# Patient Record
Sex: Female | Born: 1970 | Race: White | Hispanic: No | Marital: Married | Smoking: Former smoker
Health system: Southern US, Community
[De-identification: ages and names within clinical notes are randomized; demographics above are authoritative.]

## PROBLEM LIST (undated history)

## (undated) DIAGNOSIS — Z9889 Other specified postprocedural states: Secondary | ICD-10-CM

## (undated) DIAGNOSIS — F329 Major depressive disorder, single episode, unspecified: Secondary | ICD-10-CM

## (undated) DIAGNOSIS — K219 Gastro-esophageal reflux disease without esophagitis: Secondary | ICD-10-CM

## (undated) DIAGNOSIS — M1990 Unspecified osteoarthritis, unspecified site: Secondary | ICD-10-CM

## (undated) DIAGNOSIS — E039 Hypothyroidism, unspecified: Secondary | ICD-10-CM

## (undated) DIAGNOSIS — F32A Depression, unspecified: Secondary | ICD-10-CM

## (undated) DIAGNOSIS — R112 Nausea with vomiting, unspecified: Secondary | ICD-10-CM

## (undated) HISTORY — PX: BREAST SURGERY: SHX581

## (undated) HISTORY — PX: DILITATION & CURRETTAGE/HYSTROSCOPY WITH NOVASURE ABLATION: SHX5568

## (undated) HISTORY — PX: CHOLECYSTECTOMY: SHX55

---

## 2001-06-04 ENCOUNTER — Ambulatory Visit (HOSPITAL_COMMUNITY): Admission: RE | Admit: 2001-06-04 | Discharge: 2001-06-04 | Payer: Self-pay | Admitting: Internal Medicine

## 2001-06-04 HISTORY — PX: ESOPHAGOGASTRODUODENOSCOPY (EGD) WITH ESOPHAGEAL DILATION: SHX5812

## 2003-04-29 ENCOUNTER — Ambulatory Visit (HOSPITAL_COMMUNITY): Admission: RE | Admit: 2003-04-29 | Discharge: 2003-04-29 | Payer: Self-pay | Admitting: Internal Medicine

## 2003-04-29 HISTORY — PX: COLONOSCOPY: SHX174

## 2003-04-29 HISTORY — PX: ESOPHAGOGASTRODUODENOSCOPY: SHX1529

## 2007-04-22 ENCOUNTER — Encounter: Admission: RE | Admit: 2007-04-22 | Discharge: 2007-04-22 | Payer: Self-pay | Admitting: Gastroenterology

## 2009-03-24 ENCOUNTER — Ambulatory Visit (HOSPITAL_COMMUNITY): Admission: RE | Admit: 2009-03-24 | Discharge: 2009-03-24 | Payer: Self-pay | Admitting: Gastroenterology

## 2009-03-24 ENCOUNTER — Encounter: Admission: RE | Admit: 2009-03-24 | Discharge: 2009-03-24 | Payer: Self-pay | Admitting: Endocrinology

## 2010-10-14 NOTE — H&P (Signed)
NAME:  Valerie George NO.:  [id number]   MEDICAL RECORD NO.:  [id number]                  PATIENT TYPE:   LOCATION:                                       FACILITY:   PHYSICIAN:  R. Roetta Sessions, M.D.              DATE OF BIRTH:  06-19-1970   DATE OF ADMISSION:  04/20/2003  DATE OF DISCHARGE:                                HISTORY & PHYSICAL   CHIEF COMPLAINT:  Intermittent epigastric pain.   HISTORY OF PRESENT ILLNESS:  Valerie George is a 40 year old lady who  had her gallbladder out in February 2004 by Dr. Gabriel Cirri for biliary  dyskinesia.  Since that time, she has had some intermittent nausea and  boring epigastric pain.  He performed an EGD back in April 2004 and found  her stomach was full of food and was not able to complete the exam.  She did  have a gastric emptying study that demonstrated slight prolonged T1/2 (91  minutes).  She also has had significant constipation which has more recently  been combated most effectively with MiraLax 17 g orally daily (for a bowel  movement every day).  She was briefly on Reglan and actually tried Zelnorm.  She has no more nausea and vomiting.  Of note, she does have episodes of  boring epigastric pain which typically awake her from sleep anywhere from 2  to 3 o'clock in the morning.  It lasts no more than approximately 3 minutes.  She gets relief with drinking milk.  Her typical reflux symptoms have been  long well controlled on Prevacid 30 mg orally daily.  She does not have any  odynophagia or dysphagia although she did have some oropharyngeal and  esophageal dysphagia early last year for which I performed an EGD at [facility] and found only a small hiatal hernia.  She responded nicely to  passage of a 54-French Maloney dilator.  She is not taking any  nonsteroidals.  She has not had any melena or rectal bleeding.  LFT's were  normal on December 25, 2002 (amylase was also normal).   PAST  MEDICAL HISTORY:  1. Gastroesophageal reflux disease.  2. Biliary dyskinesia.  3. New history of diabetes mellitus.   PAST SURGICAL HISTORY:  Cholecystectomy in February 2004.   CURRENT MEDICATIONS:  1. Prevacid 30 mg orally daily.  2. Minocycline 100 mg p.r.n.  3. MiraLax 17 g orally daily.   SOCIAL HISTORY:  She has been married for seven years.  She has a 7-1/2-year-  old son.  She is a housewife.  Works part-time as a Lawyer.  She  continues to smoke cigarettes.  Rarely consumes alcohol.   REVIEW OF SYSTEMS:  As in history of present illness.   PHYSICAL EXAMINATION:  GENERAL:  A pleasant female resting comfortably.  VITAL SIGNS:  Weight 162.5, blood pressure 110/70, pulse 78.  SKIN:  Warm  and dry.  HEENT:  No scleral icterus.  JVD is not prominent.  CHEST:  Lungs are clear except for slight expiratory wheeze on the right.  ABDOMEN:  Nondistended.  She has an umbilical ring.  Positive bowel sounds,  soft, nontender without appreciable mass or organomegaly.   ASSESSMENT:  Valerie George is a 40 year old lady with intermittent epigastric  pain in the early morning hours that awakes her from sleep.  This in the  setting of taking Prevacid for gastroesophageal reflux disease.  Symptoms  are relatively short lived, lasting only 3 minutes, but she has them almost  every day.  We need to rule out peptic ulcer disease, although presentation  is somewhat atypical.  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  Longstanding constipation appears to be much better now that  she is taking MiraLax every day.  I doubt constipation is an issue.  I doubt  gastroparesis has anything to do with her current symptoms.  Because her  endoscopic evaluation by Dr. Gabriel Cirri was incomplete because of the food in  her stomach, I feel we should go ahead and repeat that study in the near  future before making further recommendations.  I have discussed this  approach with Ms.  George.  Potential risks, benefits, and alternatives  have been reviewed and questions answered.  She is agreeable with the plan  to perform esophagogastroduodenoscopy in the near future at [facility].  Further recommendations to follow.     ___________________________________________                                         Jonathon Bellows, M.D.   RMR/MEDQ  D:  04/20/2003  T:  04/20/2003  Job:  [id number]   cc:   Wyvonnia Lora  [address]  [place]  [place] [address]  Fax: [phone]

## 2010-10-14 NOTE — Op Note (Signed)
NAME:  COURTENEY, ALDERETE                     ACCOUNT NO.:  [id number]   MEDICAL RECORD NO.:  [id number]                   PATIENT TYPE:  AMB   LOCATION:  DAY                                  FACILITY:  [facility]   PHYSICIAN:  R. Roetta Sessions, M.D.              DATE OF BIRTH:  Jul 27, 1970   DATE OF PROCEDURE:  04/29/2003  DATE OF DISCHARGE:                                 OPERATIVE REPORT   PROCEDURE:  Diagnostic esophagogastroduodenoscopy and colonoscopy   ENDOSCOPIST:  Gerrit Friends. Rourk, M.D.   INDICATIONS FOR PROCEDURE:  The patient is a 40 year old lady with  intermittent epigastric pain in the early morning hours which sometimes  awakes her from sleep. She has been no Prevacid 30 mg orally daily for  gastroesophageal reflux disease, also longstanding constipation which has  been improved significantly with MiraLax.  EGD is now being done to further  evaluate her symptoms.  The approach has been discussed with the patient at  length at her last office visit on April 20, 2003; and, again today, at  the bedside.  Since she was seen on April 20, 2003 she called and wanted  to have her colon imaged as well.  I communicated with her that I felt the  yield to find any significant pathology at colonoscopy would not be very  likely. We had additional conversation today, but she is adamant about  wanting her colon checked via colonoscopy.  We talked about the risks,  benefits, and alternatives of both EGD and colonoscopy today.  Her questions  were answered.  She is agreeable; and wishes to have both EGD and  colonoscopy.  Please see my April 20, 2003 hospital dictation for more  information.   PROCEDURE NOTE:  O2 saturation, blood pressure, pulse and respirations were  monitored throughout the entire procedure.  Conscious sedation for both  procedures: Versed 7 mg IV, Demerol 150 mg IV in divided doses.   INSTRUMENT:  Olympus video chip adult gastroscope and pediatric  colonoscope.   FINDINGS:  Examination of the tubular esophagus revealed no mucosal  abnormality.  The EG junction was easily traversed.   STOMACH:  The gastric cavity was empty.  It insufflated well with air.  A  thorough examination of the gastric mucosa including a retroflex view of the  proximal stomach and esophagogastric junction demonstrated no abnormalities.  The pylorus was patent and easily traversed.   DUODENUM:  The bulb and the second portion appeared normal.   THERAPEUTIC/DIAGNOSTIC MANEUVERS:  None.   The patient tolerated the procedure well and was prepared for colonoscopy.   FINDINGS:  A digital rectal exam revealed no abnormalities.   ENDOSCOPIC FINDINGS:  The prep was good.   RECTUM:  Examination of the rectal mucosa including the retroflex view of  the anal verge revealed no abnormalities.   COLON:  The colonic mucosa was surveyed from the rectosigmoid junction  through the left transverse and right  colon to the area of the appendiceal  orifice, ileocecal valve, and cecum.  These structures were well seen and  photographed for the record.  The colonic mucosa to the cecum appeared  normal.  The terminal ileum was intubated to 10 cm.  This segment of the GI  tract also appeared normal.   From this level the scope was slowly withdrawn.  All previously mentioned  mucosal surfaces were again seen; and, again, no abnormalities were  observed.  The patient tolerated both procedures well and was reacted in  endoscopy.   EGD IMPRESSION:  Normal esophagus, stomach, D1 and D2.   COLONOSCOPY FINDINGS:  Normal rectum and terminal ileum.   The patient may be having sphincter of Oddi spasm, but her early morning  symptoms never last more than 3 minutes.   RECOMMENDATIONS:  1. Continue Prevacid 30 mg orally daily.  2. Continue MiraLax 17 gm orally on a daily p.r.n. basis for constipation.  3. Will add NuLev 1 tablet on the tongue q.i.d. p.r.n. abdominal pain.  4. We  will see this nice lady back in the office for a recheck in 1 month.      ___________________________________________                                            Jonathon Bellows, M.D.   RMR/MEDQ  D:  04/29/2003  T:  04/29/2003  Job:  [id number]   cc:   Wyvonnia Lora  [address]  [place]  [place] [address]  Fax: [phone]

## 2010-10-14 NOTE — Op Note (Signed)
[facility]  Patient:    Valerie George, Valerie George Visit Number: [id number] MRN: [id number]          Service Type: END Location: DAY Attending Physician:  Jonathon Bellows Dictated by:   Roetta Sessions, M.D. Proc. Date: 06/04/01 Admit Date:  06/04/2001   CC:         Wyvonnia Lora, M.D., [place], [place]                           Operative Report  PROCEDURE:  Esophagogastroduodenoscopy with [facility] dilation.  ENDOSCOPIST:  Roetta Sessions, M.D.  INDICATION FOR PROCEDURE:  Patient is a 40 year old lady with intermittent esophageal dysphagia and reflux symptoms; she also has oropharyngeal symptoms. She was switched from Prevacid to Nexium 40 mg daily recently without much improvement in her symptoms.  She continues to smoke.  EGD is now being done to further evaluate her long-standing rather refractory reflux symptoms as well as her dysphagia.  This approach has been discussed with the patient previously and again at the bedside.  Potential risks, benefits and alternatives have been reviewed and questions answered; she is agreeable.  I feel she is low risk for conscious sedation with Versed and Demerol.  Please see the dictation of consult note of May 30, 2001 for more information.  DESCRIPTION OF PROCEDURE:  O2 saturation, blood pressure, pulse and respirations were monitored throughout the entire procedure.  Conscious sedation:  Versed 4 mg IV and Demerol 100 mg IV in divided doses, Cetacaine spray for topical oropharyngeal anesthesia.  INSTRUMENT:  Olympus video chip gastroscope.  FINDINGS:  Examination of the tubular esophagus revealed an ungulating Z-line. There was no evidence of esophagitis, Barretts esophagus or obvious ring or stricture.  The EG junction was easily traversed.  Stomach:  Gastric cavity was empty and insufflated well with air.  Thorough examination of the gastric mucosa including a retroflexed view of the proximal stomach and esophagogastric  junction demonstrated only a small hiatal hernia. Pylorus was patent and easily trasversed.  Duodenum:  Bulb and second portion appeared normal.  THERAPEUTIC/DIAGNOSTIC MANEUVERS PERFORMED:  A 54-French Maloney dilator was passed to full insertion with good patient tolerance and without resistance. No blood was returned on the dilator.  A look back in the esophagus and stomach revealed no apparent complication related to passage of the dilator.  The patient tolerated the procedure well and was reacted in endoscopy.  IMPRESSION: 1. Ungulating but otherwise normal-appearing Z-line and normal esophagus,    status post passage of a 54-French Maloney dilator. 2. Small hiatal hernia. 3. Otherwise normal stomach and duodenum through the second portion.  The patient may have had an occult ring to account for some of her esophageal dysphagia.  Hopefully, passage of the [facility] dilator will help.  RECOMMENDATIONS: 1. Increase Nexium to 40 mg orally b.i.d. at least 30 minutes before breakfast    and supper. 2. Recommended weight loss and smoking cessation. 3. It may take a few months for her ENT symptoms to improve on a more    aggressive acid-suppression regimen. 4. I will plan to see her back in the office in three months. Dictated by:   Roetta Sessions, M.D. Attending Physician:  Jonathon Bellows DD:  06/04/01 TD:  06/04/01 Job: [id number]

## 2010-12-14 ENCOUNTER — Other Ambulatory Visit (INDEPENDENT_AMBULATORY_CARE_PROVIDER_SITE_OTHER): Payer: Self-pay | Admitting: Otolaryngology

## 2010-12-20 ENCOUNTER — Other Ambulatory Visit (HOSPITAL_COMMUNITY): Payer: Self-pay

## 2010-12-27 ENCOUNTER — Other Ambulatory Visit (HOSPITAL_COMMUNITY): Payer: Self-pay

## 2011-08-29 ENCOUNTER — Other Ambulatory Visit (INDEPENDENT_AMBULATORY_CARE_PROVIDER_SITE_OTHER): Payer: Self-pay | Admitting: Otolaryngology

## 2011-08-31 ENCOUNTER — Ambulatory Visit (HOSPITAL_COMMUNITY)
Admission: RE | Admit: 2011-08-31 | Discharge: 2011-08-31 | Disposition: A | Discharge status: Home / Self Care | Payer: BC Managed Care – PPO | Source: Ambulatory Visit | Attending: Otolaryngology | Admitting: Otolaryngology

## 2011-08-31 ENCOUNTER — Other Ambulatory Visit (HOSPITAL_COMMUNITY): Payer: Self-pay

## 2011-08-31 DIAGNOSIS — J342 Deviated nasal septum: Secondary | ICD-10-CM | POA: Insufficient documentation

## 2011-08-31 DIAGNOSIS — J320 Chronic maxillary sinusitis: Secondary | ICD-10-CM | POA: Insufficient documentation

## 2011-08-31 DIAGNOSIS — R42 Dizziness and giddiness: Secondary | ICD-10-CM | POA: Insufficient documentation

## 2011-08-31 DIAGNOSIS — R51 Headache: Secondary | ICD-10-CM | POA: Insufficient documentation

## 2011-09-14 ENCOUNTER — Ambulatory Visit (INDEPENDENT_AMBULATORY_CARE_PROVIDER_SITE_OTHER): Payer: BC Managed Care – PPO | Admitting: Otolaryngology

## 2011-09-14 DIAGNOSIS — R51 Headache: Secondary | ICD-10-CM

## 2011-09-14 DIAGNOSIS — J342 Deviated nasal septum: Secondary | ICD-10-CM

## 2011-09-14 DIAGNOSIS — G43109 Migraine with aura, not intractable, without status migrainosus: Secondary | ICD-10-CM

## 2011-09-14 DIAGNOSIS — J320 Chronic maxillary sinusitis: Secondary | ICD-10-CM

## 2012-01-18 ENCOUNTER — Ambulatory Visit (INDEPENDENT_AMBULATORY_CARE_PROVIDER_SITE_OTHER): Payer: BC Managed Care – PPO | Admitting: Otolaryngology

## 2016-07-10 ENCOUNTER — Ambulatory Visit (INDEPENDENT_AMBULATORY_CARE_PROVIDER_SITE_OTHER): Payer: Self-pay | Admitting: Otolaryngology

## 2018-05-13 ENCOUNTER — Ambulatory Visit (INDEPENDENT_AMBULATORY_CARE_PROVIDER_SITE_OTHER): Payer: BLUE CROSS/BLUE SHIELD | Admitting: Otolaryngology

## 2018-05-13 DIAGNOSIS — R42 Dizziness and giddiness: Secondary | ICD-10-CM | POA: Diagnosis not present

## 2018-05-13 DIAGNOSIS — J343 Hypertrophy of nasal turbinates: Secondary | ICD-10-CM | POA: Diagnosis not present

## 2018-05-13 DIAGNOSIS — J310 Chronic rhinitis: Secondary | ICD-10-CM | POA: Diagnosis not present

## 2018-05-13 DIAGNOSIS — J342 Deviated nasal septum: Secondary | ICD-10-CM

## 2018-05-14 ENCOUNTER — Other Ambulatory Visit (INDEPENDENT_AMBULATORY_CARE_PROVIDER_SITE_OTHER): Payer: Self-pay | Admitting: Otolaryngology

## 2018-05-14 DIAGNOSIS — J320 Chronic maxillary sinusitis: Secondary | ICD-10-CM

## 2018-05-24 ENCOUNTER — Emergency Department (HOSPITAL_COMMUNITY)
Admission: EM | Admit: 2018-05-24 | Discharge: 2018-05-24 | Disposition: A | Discharge status: Home / Self Care | Payer: BLUE CROSS/BLUE SHIELD

## 2018-05-24 ENCOUNTER — Ambulatory Visit (HOSPITAL_COMMUNITY)
Admission: RE | Admit: 2018-05-24 | Discharge: 2018-05-24 | Disposition: A | Discharge status: Home / Self Care | Payer: BLUE CROSS/BLUE SHIELD | Source: Ambulatory Visit | Attending: Otolaryngology | Admitting: Otolaryngology

## 2018-05-24 DIAGNOSIS — J320 Chronic maxillary sinusitis: Secondary | ICD-10-CM

## 2018-06-03 ENCOUNTER — Ambulatory Visit (INDEPENDENT_AMBULATORY_CARE_PROVIDER_SITE_OTHER): Payer: BLUE CROSS/BLUE SHIELD | Admitting: Otolaryngology

## 2018-06-03 DIAGNOSIS — J310 Chronic rhinitis: Secondary | ICD-10-CM | POA: Diagnosis not present

## 2018-06-03 DIAGNOSIS — J342 Deviated nasal septum: Secondary | ICD-10-CM

## 2018-06-03 DIAGNOSIS — J343 Hypertrophy of nasal turbinates: Secondary | ICD-10-CM

## 2018-06-24 ENCOUNTER — Other Ambulatory Visit: Payer: Self-pay | Admitting: Otolaryngology

## 2018-07-22 ENCOUNTER — Other Ambulatory Visit: Payer: Self-pay

## 2018-07-22 ENCOUNTER — Encounter (HOSPITAL_BASED_OUTPATIENT_CLINIC_OR_DEPARTMENT_OTHER): Payer: Self-pay | Admitting: *Deleted

## 2018-07-30 ENCOUNTER — Ambulatory Visit (HOSPITAL_BASED_OUTPATIENT_CLINIC_OR_DEPARTMENT_OTHER): Payer: BLUE CROSS/BLUE SHIELD | Admitting: Anesthesiology

## 2018-07-30 ENCOUNTER — Encounter (HOSPITAL_BASED_OUTPATIENT_CLINIC_OR_DEPARTMENT_OTHER)
Admission: RE | Disposition: A | Discharge status: Home / Self Care | Payer: Self-pay | Source: Home / Self Care | Attending: Otolaryngology

## 2018-07-30 ENCOUNTER — Ambulatory Visit (HOSPITAL_BASED_OUTPATIENT_CLINIC_OR_DEPARTMENT_OTHER)
Admission: RE | Admit: 2018-07-30 | Discharge: 2018-07-30 | Disposition: A | Discharge status: Home / Self Care | Payer: BLUE CROSS/BLUE SHIELD | Attending: Otolaryngology | Admitting: Otolaryngology

## 2018-07-30 ENCOUNTER — Encounter (HOSPITAL_BASED_OUTPATIENT_CLINIC_OR_DEPARTMENT_OTHER): Payer: Self-pay | Admitting: *Deleted

## 2018-07-30 DIAGNOSIS — J342 Deviated nasal septum: Secondary | ICD-10-CM

## 2018-07-30 DIAGNOSIS — J343 Hypertrophy of nasal turbinates: Secondary | ICD-10-CM | POA: Insufficient documentation

## 2018-07-30 DIAGNOSIS — J310 Chronic rhinitis: Secondary | ICD-10-CM | POA: Insufficient documentation

## 2018-07-30 DIAGNOSIS — Z87891 Personal history of nicotine dependence: Secondary | ICD-10-CM | POA: Diagnosis not present

## 2018-07-30 DIAGNOSIS — R51 Headache: Secondary | ICD-10-CM | POA: Diagnosis not present

## 2018-07-30 DIAGNOSIS — J3489 Other specified disorders of nose and nasal sinuses: Secondary | ICD-10-CM | POA: Diagnosis present

## 2018-07-30 HISTORY — DX: Major depressive disorder, single episode, unspecified: F32.9

## 2018-07-30 HISTORY — DX: Depression, unspecified: F32.A

## 2018-07-30 HISTORY — DX: Other specified postprocedural states: R11.2

## 2018-07-30 HISTORY — PX: NASAL SEPTOPLASTY W/ TURBINOPLASTY: SHX2070

## 2018-07-30 HISTORY — DX: Unspecified osteoarthritis, unspecified site: M19.90

## 2018-07-30 HISTORY — DX: Gastro-esophageal reflux disease without esophagitis: K21.9

## 2018-07-30 HISTORY — DX: Hypothyroidism, unspecified: E03.9

## 2018-07-30 HISTORY — DX: Other specified postprocedural states: Z98.890

## 2018-07-30 SURGERY — SEPTOPLASTY, NOSE, WITH NASAL TURBINATE REDUCTION
Anesthesia: General | Site: Nose | Laterality: Bilateral

## 2018-07-30 MED ORDER — DEXAMETHASONE SODIUM PHOSPHATE 4 MG/ML IJ SOLN
INTRAMUSCULAR | Status: DC | PRN
  Administered 2018-07-30: 10 mg via INTRAVENOUS

## 2018-07-30 MED ORDER — PROPOFOL 10 MG/ML IV BOLUS
INTRAVENOUS | Status: DC | PRN
  Administered 2018-07-30: 150 mg via INTRAVENOUS

## 2018-07-30 MED ORDER — OXYCODONE-ACETAMINOPHEN 5-325 MG PO TABS: 1.0000 | ORAL_TABLET | ORAL | 0 refills | Status: DC | PRN

## 2018-07-30 MED ORDER — SUGAMMADEX SODIUM 200 MG/2ML IV SOLN
INTRAVENOUS | Status: AC
  Filled 2018-07-30: qty 2

## 2018-07-30 MED ORDER — FENTANYL CITRATE (PF) 100 MCG/2ML IJ SOLN
25.0000 ug | INTRAMUSCULAR | Status: DC | PRN
  Administered 2018-07-30 (×2): 50 ug via INTRAVENOUS

## 2018-07-30 MED ORDER — ONDANSETRON HCL 4 MG PO TABS: 4.0000 mg | ORAL_TABLET | Freq: Four times a day (QID) | ORAL | 2 refills | Status: DC | PRN

## 2018-07-30 MED ORDER — ACETAMINOPHEN 10 MG/ML IV SOLN
INTRAVENOUS | Status: AC
  Filled 2018-07-30: qty 100

## 2018-07-30 MED ORDER — MUPIROCIN 2 % EX OINT
TOPICAL_OINTMENT | CUTANEOUS | Status: DC | PRN
  Administered 2018-07-30: 1 via TOPICAL

## 2018-07-30 MED ORDER — ROCURONIUM BROMIDE 100 MG/10ML IV SOLN
INTRAVENOUS | Status: DC | PRN
  Administered 2018-07-30: 50 mg via INTRAVENOUS

## 2018-07-30 MED ORDER — PROMETHAZINE HCL 25 MG/ML IJ SOLN
INTRAMUSCULAR | Status: AC
  Filled 2018-07-30: qty 1

## 2018-07-30 MED ORDER — ACETAMINOPHEN 10 MG/ML IV SOLN
1000.0000 mg | Freq: Four times a day (QID) | INTRAVENOUS | Status: DC
  Administered 2018-07-30: 1000 mg via INTRAVENOUS

## 2018-07-30 MED ORDER — LIDOCAINE HCL (CARDIAC) PF 100 MG/5ML IV SOSY
PREFILLED_SYRINGE | INTRAVENOUS | Status: DC | PRN
  Administered 2018-07-30: 80 mg via INTRAVENOUS

## 2018-07-30 MED ORDER — PROMETHAZINE HCL 25 MG/ML IJ SOLN
6.2500 mg | Freq: Once | INTRAMUSCULAR | Status: AC
  Administered 2018-07-30: 6.25 mg via INTRAVENOUS

## 2018-07-30 MED ORDER — FENTANYL CITRATE (PF) 100 MCG/2ML IJ SOLN
INTRAMUSCULAR | Status: AC
  Filled 2018-07-30: qty 2

## 2018-07-30 MED ORDER — SCOPOLAMINE 1 MG/3DAYS TD PT72
MEDICATED_PATCH | TRANSDERMAL | Status: AC
  Filled 2018-07-30: qty 1

## 2018-07-30 MED ORDER — PROPOFOL 10 MG/ML IV BOLUS
INTRAVENOUS | Status: AC
  Filled 2018-07-30: qty 20

## 2018-07-30 MED ORDER — ARTIFICIAL TEARS OPHTHALMIC OINT
TOPICAL_OINTMENT | OPHTHALMIC | Status: AC
  Filled 2018-07-30: qty 3.5

## 2018-07-30 MED ORDER — SCOPOLAMINE 1 MG/3DAYS TD PT72
1.0000 | MEDICATED_PATCH | Freq: Once | TRANSDERMAL | Status: DC | PRN
  Administered 2018-07-30: 1.5 mg via TRANSDERMAL

## 2018-07-30 MED ORDER — FENTANYL CITRATE (PF) 100 MCG/2ML IJ SOLN
50.0000 ug | INTRAMUSCULAR | Status: DC | PRN
  Administered 2018-07-30: 100 ug via INTRAVENOUS

## 2018-07-30 MED ORDER — ONDANSETRON HCL 4 MG/2ML IJ SOLN: 4.0000 mg | Freq: Once | INTRAMUSCULAR | Status: DC | PRN

## 2018-07-30 MED ORDER — AMOXICILLIN 875 MG PO TABS: 875.0000 mg | ORAL_TABLET | Freq: Two times a day (BID) | ORAL | 0 refills | Status: AC

## 2018-07-30 MED ORDER — KETOROLAC TROMETHAMINE 30 MG/ML IJ SOLN
30.0000 mg | Freq: Once | INTRAMUSCULAR | Status: AC
  Administered 2018-07-30: 30 mg via INTRAVENOUS

## 2018-07-30 MED ORDER — KETOROLAC TROMETHAMINE 30 MG/ML IJ SOLN
INTRAMUSCULAR | Status: AC
  Filled 2018-07-30: qty 1

## 2018-07-30 MED ORDER — SUGAMMADEX SODIUM 200 MG/2ML IV SOLN
INTRAVENOUS | Status: DC | PRN
  Administered 2018-07-30: 200 mg via INTRAVENOUS

## 2018-07-30 MED ORDER — ONDANSETRON HCL 4 MG/2ML IJ SOLN
INTRAMUSCULAR | Status: DC | PRN
  Administered 2018-07-30: 4 mg via INTRAVENOUS

## 2018-07-30 MED ORDER — CEFAZOLIN SODIUM-DEXTROSE 2-3 GM-%(50ML) IV SOLR
INTRAVENOUS | Status: DC | PRN
  Administered 2018-07-30: 2 g via INTRAVENOUS

## 2018-07-30 MED ORDER — LIDOCAINE-EPINEPHRINE 1 %-1:100000 IJ SOLN
INTRAMUSCULAR | Status: DC | PRN
  Administered 2018-07-30: 3 mL

## 2018-07-30 MED ORDER — OXYCODONE HCL 5 MG PO TABS: 5.0000 mg | ORAL_TABLET | Freq: Once | ORAL | Status: DC | PRN

## 2018-07-30 MED ORDER — ONDANSETRON HCL 4 MG/2ML IJ SOLN
INTRAMUSCULAR | Status: AC
  Filled 2018-07-30: qty 2

## 2018-07-30 MED ORDER — OXYCODONE HCL 5 MG/5ML PO SOLN: 5.0000 mg | Freq: Once | ORAL | Status: DC | PRN

## 2018-07-30 MED ORDER — LACTATED RINGERS IV SOLN
INTRAVENOUS | Status: DC
  Administered 2018-07-30: 09:00:00 via INTRAVENOUS

## 2018-07-30 MED ORDER — OXYMETAZOLINE HCL 0.05 % NA SOLN
NASAL | Status: DC | PRN
  Administered 2018-07-30: 1 via TOPICAL

## 2018-07-30 MED ORDER — ROCURONIUM BROMIDE 50 MG/5ML IV SOSY
PREFILLED_SYRINGE | INTRAVENOUS | Status: AC
  Filled 2018-07-30: qty 5

## 2018-07-30 MED ORDER — LIDOCAINE 2% (20 MG/ML) 5 ML SYRINGE
INTRAMUSCULAR | Status: AC
  Filled 2018-07-30: qty 5

## 2018-07-30 MED ORDER — CEFAZOLIN SODIUM 1 G IJ SOLR
INTRAMUSCULAR | Status: AC
  Filled 2018-07-30: qty 20

## 2018-07-30 MED ORDER — DEXAMETHASONE SODIUM PHOSPHATE 10 MG/ML IJ SOLN
INTRAMUSCULAR | Status: AC
  Filled 2018-07-30: qty 1

## 2018-07-30 MED ORDER — MIDAZOLAM HCL 2 MG/2ML IJ SOLN
1.0000 mg | INTRAMUSCULAR | Status: DC | PRN
  Administered 2018-07-30: 2 mg via INTRAVENOUS

## 2018-07-30 MED ORDER — MIDAZOLAM HCL 2 MG/2ML IJ SOLN
INTRAMUSCULAR | Status: AC
  Filled 2018-07-30: qty 2

## 2018-07-30 SURGICAL SUPPLY — 38 items
ATTRACTOMAT 16X20 MAGNETIC DRP (DRAPES) IMPLANT
BLADE SURG 15 STRL LF DISP TIS (BLADE) IMPLANT
BLADE SURG 15 STRL SS (BLADE) ×3
CANISTER SUCT 1200ML W/VALVE (MISCELLANEOUS) ×3 IMPLANT
COAGULATOR SUCT 8FR VV (MISCELLANEOUS) ×3 IMPLANT
COVER WAND RF STERILE (DRAPES) IMPLANT
DECANTER SPIKE VIAL GLASS SM (MISCELLANEOUS) IMPLANT
DRSG NASOPORE 8CM (GAUZE/BANDAGES/DRESSINGS) IMPLANT
DRSG TELFA 3X8 NADH (GAUZE/BANDAGES/DRESSINGS) IMPLANT
ELECT REM PT RETURN 9FT ADLT (ELECTROSURGICAL) ×3
ELECTRODE REM PT RTRN 9FT ADLT (ELECTROSURGICAL) ×1 IMPLANT
GLOVE BIO SURGEON STRL SZ7 (GLOVE) ×2 IMPLANT
GLOVE BIO SURGEON STRL SZ7.5 (GLOVE) ×3 IMPLANT
GOWN STRL REUS W/ TWL LRG LVL3 (GOWN DISPOSABLE) ×2 IMPLANT
GOWN STRL REUS W/ TWL XL LVL3 (GOWN DISPOSABLE) IMPLANT
GOWN STRL REUS W/TWL LRG LVL3 (GOWN DISPOSABLE) ×3
GOWN STRL REUS W/TWL XL LVL3 (GOWN DISPOSABLE) ×3
NDL HYPO 25X1 1.5 SAFETY (NEEDLE) ×1 IMPLANT
NEEDLE HYPO 25X1 1.5 SAFETY (NEEDLE) ×3 IMPLANT
NS IRRIG 1000ML POUR BTL (IV SOLUTION) ×3 IMPLANT
PACK BASIN DAY SURGERY FS (CUSTOM PROCEDURE TRAY) ×3 IMPLANT
PACK ENT DAY SURGERY (CUSTOM PROCEDURE TRAY) ×3 IMPLANT
PAD DRESSING TELFA 3X8 NADH (GAUZE/BANDAGES/DRESSINGS) IMPLANT
SLEEVE SCD COMPRESS KNEE MED (MISCELLANEOUS) ×2 IMPLANT
SOLUTION BUTLER CLEAR DIP (MISCELLANEOUS) ×3 IMPLANT
SPLINT NASAL AIRWAY SILICONE (MISCELLANEOUS) ×2 IMPLANT
SPONGE GAUZE 2X2 8PLY STER LF (GAUZE/BANDAGES/DRESSINGS) ×1
SPONGE GAUZE 2X2 8PLY STRL LF (GAUZE/BANDAGES/DRESSINGS) ×2 IMPLANT
SPONGE NEURO XRAY DETECT 1X3 (DISPOSABLE) ×3 IMPLANT
SUT CHROMIC 4 0 P 3 18 (SUTURE) ×3 IMPLANT
SUT PLAIN 4 0 ~~LOC~~ 1 (SUTURE) ×3 IMPLANT
SUT PROLENE 3 0 PS 2 (SUTURE) ×3 IMPLANT
SUT VIC AB 4-0 P-3 18XBRD (SUTURE) IMPLANT
SUT VIC AB 4-0 P3 18 (SUTURE)
TOWEL GREEN STERILE FF (TOWEL DISPOSABLE) ×3 IMPLANT
TUBE SALEM SUMP 12R W/ARV (TUBING) IMPLANT
TUBE SALEM SUMP 16 FR W/ARV (TUBING) ×3 IMPLANT
YANKAUER SUCT BULB TIP NO VENT (SUCTIONS) ×3 IMPLANT

## 2018-07-30 NOTE — Op Note (Signed)
DATE OF PROCEDURE: 07/30/2018  OPERATIVE REPORT   SURGEON: Newman Pies, MD   PREOPERATIVE DIAGNOSES:  1. Severe nasal septal deviation.  2. Bilateral inferior turbinate hypertrophy.  3. Chronic nasal obstruction.  POSTOPERATIVE DIAGNOSES:  1. Severe nasal septal deviation.  2. Bilateral inferior turbinate hypertrophy.  3. Chronic nasal obstruction.  PROCEDURE PERFORMED:  1. Septoplasty.  2. Bilateral partial inferior turbinate resection.   ANESTHESIA: General endotracheal tube anesthesia.   COMPLICATIONS: None.   ESTIMATED BLOOD LOSS: 50 mL.   INDICATION FOR PROCEDURE: Valerie George is a 48 y.o. female with a history of chronic nasal obstruction and headache. The patient was previously treated with antihistamine, decongestant, and steroid nasal sprays. However, the patient continued to be symptomatic. On examination, the patient was noted to have bilateral severe inferior turbinate hypertrophy and significant nasal septal deviation, causing significant nasal obstruction. Based on the above findings, the decision was made for the patient to undergo the above-stated procedures. The risks, benefits, alternatives, and details of the procedures were discussed with the patient. Questions were invited and answered. Informed consent was obtained.   DESCRIPTION OF PROCEDURE: The patient was taken to the operating room and placed supine on the operating table. General endotracheal tube anesthesia was administered by the anesthesiologist. The patient was positioned, and prepped and draped in the standard fashion for nasal surgery. Pledgets soaked with Afrin were placed in both nasal cavities for decongestion. The pledgets were subsequently removed.  Examination of the nasal cavity revealed a severe nasal septal deviationd. 1% lidocaine with 1:100,000 epinephrine was injected onto the nasal septum bilaterally. A hemitransfixion incision was made on the left side. The mucosal flap was carefully  elevated on the left side. A cartilaginous incision was made 1 cm superior to the caudal margin of the nasal septum. Mucosal flap was also elevated on the right side in the similar fashion. It should be noted that due to the severe septal deviation, the deviated portion of the cartilaginous and bony septum had to be removed in piecemeal fashion. Once the deviated portions were removed, a straight midline septum was achieved. The septum was then quilted with 4-0 plain gut sutures. The hemitransfixion incision was closed with interrupted 4-0 chromic sutures.   The inferior one half of both hypertrophied inferior turbinate was crossclamped with a Kelly clamp. The inferior one half of each inferior turbinate was then resected with a pair of cross cutting scissors. Hemostasis was achieved with a suction cautery device.  Doyle splints were applied to the nasal septum.  The care of the patient was turned over to the anesthesiologist. The patient was awakened from anesthesia without difficulty. The patient was extubated and transferred to the recovery room in good condition.   OPERATIVE FINDINGS: Severe nasal septal deviation and bilateral inferior turbinate hypertrophy.   SPECIMEN: None.   FOLLOWUP CARE: The patient be discharged home once she is awake and alert. The patient will be placed on Percocet 1 tablets p.o. q.4 hours p.r.n. pain, and amoxicillin 875 mg p.o. b.i.d. for 5 days. The patient will follow up in my office in 2 days for splint removal.   Deetra Booton Philomena Doheny, MD

## 2018-07-30 NOTE — H&P (Signed)
Cc: Chronic nasal obstruction  HPI: The patient is a 48 year old female who returns today for her follow-up evaluation. The patient was last seen 1 month ago.  At that time, she was noted to have chronic nasal obstruction and recurrent headaches.  She was noted to have nasal septal deviation and bilateral inferior turbinate hypertrophy. The patient was previously diagnosed with multiple episodes of sinusitis. She was subsequently evaluated with a sinus CT scan.  The CT showed no evidence of significant acute or chronic sinusitis.  However, she does have severe nasal septal deviation, bilateral inferior turbinate hypertrophy and bilateral large concha bullosa.  The patient was treated with Flonase nasal spray.  According to the patient, she continues to have significant nasal obstruction. She is a habitual mouth breather.  She also snores loudly at night.  She continues to have significant headaches, especially around her forehead.  She denies any purulent drainage, fever, or visual change.  No other ENT, GI, or respiratory issue noted since the last visit.   Exam General: Communicates without difficulty, well nourished, no acute distress. Head: Normocephalic, no evidence injury, no tenderness, facial buttresses intact without stepoff. Eyes: PERRL, EOMI. No scleral icterus, conjunctivae clear. Neuro: CN II exam reveals vision grossly intact.  No nystagmus at any point of gaze. Ears: Auricles well formed without lesions.  Ear canals are intact without mass or lesion.  No erythema or edema is appreciated.  The TMs are intact without fluid. Nose: External evaluation reveals normal support and skin without lesions.  Dorsum is intact.  Anterior rhinoscopy reveals congested and edematous mucosa over anterior aspect of the inferior turbinates and deviated nasal septum.  No purulence is noted. Middle meatus is not well visualized. Oral:  Oral cavity and oropharynx are intact, symmetric, without erythema or edema.   Mucosa is moist without lesions. Neck: Full range of motion without pain.  There is no significant lymphadenopathy.  No masses palpable.  Thyroid bed within normal limits to palpation.  Parotid glands and submandibular glands equal bilaterally without mass.  Trachea is midline. Neuro:  CN 2-12 grossly intact. Gait normal. Vestibular: No nystagmus at any point of gaze.   Assessment 1.  Chronic rhinitis with nasal septal deviation, bilateral inferior turbinate hypertrophy and bilateral large concha bullosa.  Her nasal passageways are more than 95% obstructed.  2.  There is no evidence of acute or chronic sinusitis on her sinus CT scan.  3.  The patient's headache may be neurogenic in etiology.   Plan  1.  The physical exam findings and the CT images are extensively reviewed with the patient.  2.  The treatment options are also discussed.  The options include continuing medical treatment versus surgical intervention with septoplasty, turbinate reduction, and bilateral endoscopic concha bullosa resection.  The risks, benefits and details of the procedures are reviewed with the patient.    3.  The patient should continue with her daily Flonase nasal spray.  4.  The patient would like to proceed with the surgical intervention.

## 2018-07-30 NOTE — Transfer of Care (Signed)
Immediate Anesthesia Transfer of Care Note  Patient: Camoni Penza  Procedure(s) Performed: NASAL SEPTOPLASTY WITH BILATERAL TURBINATE REDUCTION (Bilateral Nose)  Patient Location: PACU  Anesthesia Type:General  Level of Consciousness: awake, sedated and patient cooperative  Airway & Oxygen Therapy: Patient Spontanous Breathing  Post-op Assessment: Report given to RN and Post -op Vital signs reviewed and stable  Post vital signs: Reviewed and stable  Last Vitals:  Vitals Value Taken Time  BP    Temp    Pulse 91 07/30/2018 10:30 AM  Resp 14 07/30/2018 10:30 AM  SpO2 98 % 07/30/2018 10:30 AM  Vitals shown include unvalidated device data.  Last Pain:  Vitals:   07/30/18 0836  TempSrc: Oral  PainSc: 2          Complications: No apparent anesthesia complications

## 2018-07-30 NOTE — Discharge Instructions (Addendum)
Next dose Tylenol or ibuprofen at 6:15 PM.   POSTOPERATIVE INSTRUCTIONS FOR PATIENTS HAVING NASAL OR SINUS OPERATIONS ACTIVITY: Restrict activity at home for the first two days, resting as much as possible. Light activity is best. You may usually return to work within a week. You should refrain from nose blowing, strenuous activity, or heavy lifting greater than 20lbs for a total of three weeks after your operation.  If sneezing cannot be avoided, sneeze with your mouth open. DISCOMFORT: You may experience a dull headache and pressure along with nasal congestion and discharge. These symptoms may be worse during the first week after the operation but may last as long as two to four weeks.  Please take Tylenol or the pain medication that has been prescribed for you. Do not take aspirin or aspirin containing medications since they may cause bleeding.  You may experience symptoms of post nasal drainage, nasal congestion, headaches and fatigue for two or three months after your operation.  BLEEDING: You may have some blood tinged nasal drainage for approximately two weeks after the operation.  The discharge will be worse for the first week.  Please call our office at [phone] or go to the nearest hospital emergency room if you experience any of the following: heavy, bright red blood from your nose or mouth that lasts longer than ten minutes or coughing up or vomiting bright red blood or blood clots. GENERAL CONSIDERATIONS: 1. A gauze dressing will be placed on your upper lip to absorb any drainage after the operation. You may need to change this several times a day.  If you do not have very much drainage, you may remove the dressing.  Remember that you may gently wipe your nose with a tissue and sniff in, but DO NOT blow your nose. 2. Please keep all of your postoperative appointments.  Your final results after the operation will depend on proper follow-up.  The initial visit is usually four to seven days  after the operation.  During this visit, the remaining nasal packing and internal septal splints will be removed.  Your nasal and sinus cavities will be cleaned.  During the second visit, your nasal and sinus cavities will be cleaned again. Have someone drive you to your first two postoperative appointments. We suggest that you take your prescribed pain medication about  hour prior to each of these two appointments.  3. How you care for your nose after the operation will influence the results that you obtain.  You should follow all directions, take your medication as prescribed, and call our office [phone] with any problems or questions. 4. You may be more comfortable sleeping with your head elevated on two pillows. 5. Do not take any medications that we have not prescribed or recommended. WARNING SIGNS: if any of the following should occur, please call our office: 1. Bright red bleeding which lasts more than 10 minutes. 2. Persistent fever greater than 102F. 3. Persistent vomiting. 4. Severe and constant pain that is not relieved by prescribed pain medication. 5. Trauma to the nose. 6. Rash or unusual side effects from any medicines.     Post Anesthesia Home Care Instructions  Activity: Get plenty of rest for the remainder of the day. A responsible individual must stay with you for 24 hours following the procedure.  For the next 24 hours, DO NOT: -Drive a car -Advertising copywriter -Drink alcoholic beverages -Take any medication unless instructed by your physician -Make any legal decisions or sign important papers.  Meals: Start with liquid foods such as gelatin or soup. Progress to regular foods as tolerated. Avoid greasy, spicy, heavy foods. If nausea and/or vomiting occur, drink only clear liquids until the nausea and/or vomiting subsides. Call your physician if vomiting continues.  Special Instructions/Symptoms: Your throat may feel dry or sore from the anesthesia or the  breathing tube placed in your throat during surgery. If this causes discomfort, gargle with warm salt water. The discomfort should disappear within 24 hours.  If you had a scopolamine patch placed behind your ear for the management of post- operative nausea and/or vomiting:  1. The medication in the patch is effective for 72 hours, after which it should be removed.  Wrap patch in a tissue and discard in the trash. Wash hands thoroughly with soap and water. 2. You may remove the patch earlier than 72 hours if you experience unpleasant side effects which may include dry mouth, dizziness or visual disturbances. 3. Avoid touching the patch. Wash your hands with soap and water after contact with the patch.

## 2018-07-30 NOTE — Anesthesia Postprocedure Evaluation (Signed)
Anesthesia Post Note  Patient: Valerie George  Procedure(s) Performed: NASAL SEPTOPLASTY WITH BILATERAL TURBINATE REDUCTION (Bilateral Nose)     Patient location during evaluation: PACU Anesthesia Type: General Level of consciousness: awake and alert Pain management: pain level controlled Vital Signs Assessment: post-procedure vital signs reviewed and stable Respiratory status: spontaneous breathing, nonlabored ventilation and respiratory function stable Cardiovascular status: blood pressure returned to baseline and stable Postop Assessment: no apparent nausea or vomiting Anesthetic complications: no    Last Vitals:  Vitals:   07/30/18 1130 07/30/18 1140  BP: 110/66 134/77  Pulse: 74 70  Resp: 13 16  Temp:  36.5 C  SpO2: 99% 100%    Last Pain:  Vitals:   07/30/18 1140  TempSrc:   PainSc: 4                  Lucretia Kern

## 2018-07-30 NOTE — Anesthesia Procedure Notes (Signed)
Procedure Name: Intubation Date/Time: 07/30/2018 9:31 AM Performed by: Lyndee Leo, CRNA Pre-anesthesia Checklist: Patient identified, Emergency Drugs available, Suction available and Patient being monitored Patient Re-evaluated:Patient Re-evaluated prior to induction Oxygen Delivery Method: Circle system utilized Preoxygenation: Pre-oxygenation with 100% oxygen Induction Type: IV induction Ventilation: Mask ventilation without difficulty Laryngoscope Size: Mac and 3 Grade View: Grade I Tube type: Oral Rae Number of attempts: 1 Airway Equipment and Method: Stylet and Oral airway Placement Confirmation: ETT inserted through vocal cords under direct vision,  positive ETCO2 and breath sounds checked- equal and bilateral Secured at: 20 cm Tube secured with: Tape Dental Injury: Teeth and Oropharynx as per pre-operative assessment

## 2018-07-30 NOTE — Anesthesia Preprocedure Evaluation (Addendum)
Anesthesia Evaluation  Patient identified by MRN, date of birth, ID band Patient awake    Reviewed: Allergy & Precautions, NPO status , Patient's Chart, lab work & pertinent test results  History of Anesthesia Complications (+) PONVNegative for: history of anesthetic complications  Airway Mallampati: I  TM Distance: >3 FB Neck ROM: Full    Dental no notable dental hx.    Pulmonary neg pulmonary ROS, former smoker,    Pulmonary exam normal        Cardiovascular negative cardio ROS Normal cardiovascular exam     Neuro/Psych PSYCHIATRIC DISORDERS Depression negative neurological ROS     GI/Hepatic Neg liver ROS, GERD  ,  Endo/Other  Hypothyroidism   Renal/GU negative Renal ROS  negative genitourinary   Musculoskeletal negative musculoskeletal ROS (+)   Abdominal   Peds  Hematology negative hematology ROS (+)   Anesthesia Other Findings 48 yo F for septoplasty/turbinate reduction - PONV, depression, hypothyroid, GERD  Reproductive/Obstetrics                            Anesthesia Physical Anesthesia Plan  ASA: II  Anesthesia Plan: General   Post-op Pain Management:    Induction: Intravenous  PONV Risk Score and Plan: 4 or greater and Ondansetron, Dexamethasone, Midazolam and Treatment may vary due to age or medical condition  Airway Management Planned: Oral ETT  Additional Equipment: None  Intra-op Plan:   Post-operative Plan: Extubation in OR  Informed Consent: I have reviewed the patients History and Physical, chart, labs and discussed the procedure including the risks, benefits and alternatives for the proposed anesthesia with the patient or authorized representative who has indicated his/her understanding and acceptance.     Dental advisory given  Plan Discussed with:   Anesthesia Plan Comments:        Anesthesia Quick Evaluation

## 2018-07-31 ENCOUNTER — Encounter (HOSPITAL_BASED_OUTPATIENT_CLINIC_OR_DEPARTMENT_OTHER): Payer: Self-pay | Admitting: Otolaryngology

## 2018-08-01 ENCOUNTER — Ambulatory Visit (INDEPENDENT_AMBULATORY_CARE_PROVIDER_SITE_OTHER): Payer: BLUE CROSS/BLUE SHIELD | Admitting: Otolaryngology

## 2018-08-15 ENCOUNTER — Ambulatory Visit (INDEPENDENT_AMBULATORY_CARE_PROVIDER_SITE_OTHER): Payer: BLUE CROSS/BLUE SHIELD | Admitting: Otolaryngology

## 2019-05-24 IMAGING — CT CT MAXILLOFACIAL W/O CM
3 series · 14 of 47 positions shown, 16 images · non-contrast
Comparison: Prior CT from 08/31/2011.

CLINICAL DATA: Initial evaluation for chronic sinusitis.

EXAM:
CT MAXILLOFACIAL WITHOUT CONTRAST
TECHNIQUE: Multidetector CT imaging of the maxillofacial structures was
performed. Multiplanar CT image reconstructions were also generated.

[Series 2: standard · axial · 0.36mm/px · z∈[-495,-382]mm · 8 of 131 slices shown, 10 images]
[im 9/131  brain]
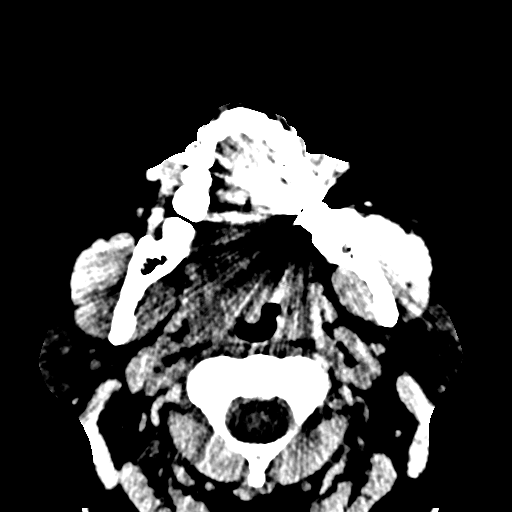
[im 9/131  bone]
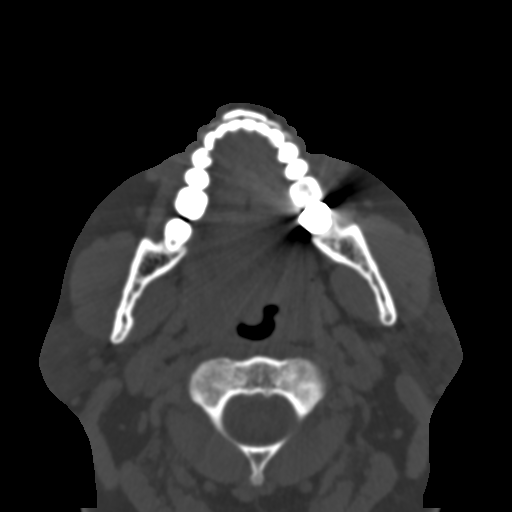
[im 27/131  bone]
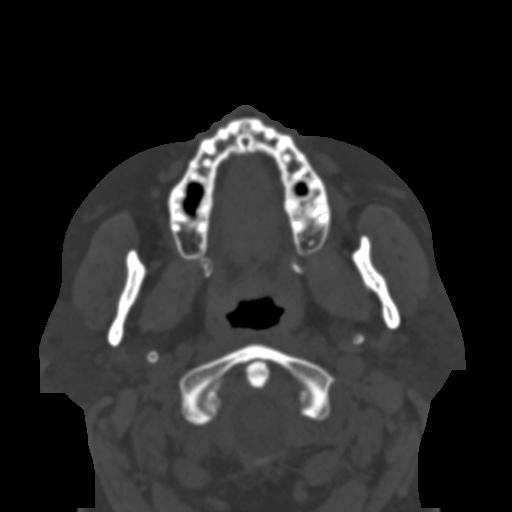
[im 41/131  bone]
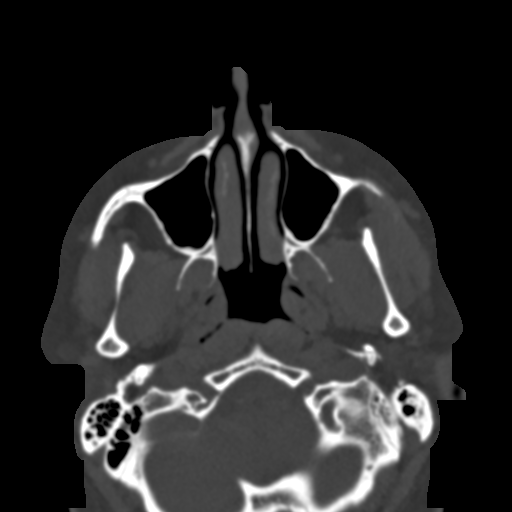
[im 59/131  bone]
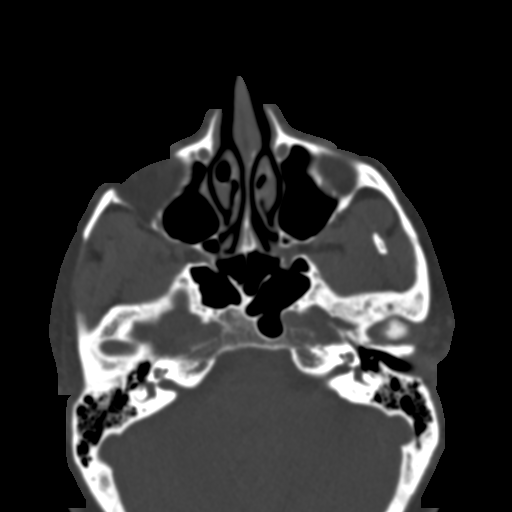
[im 72/131  brain]
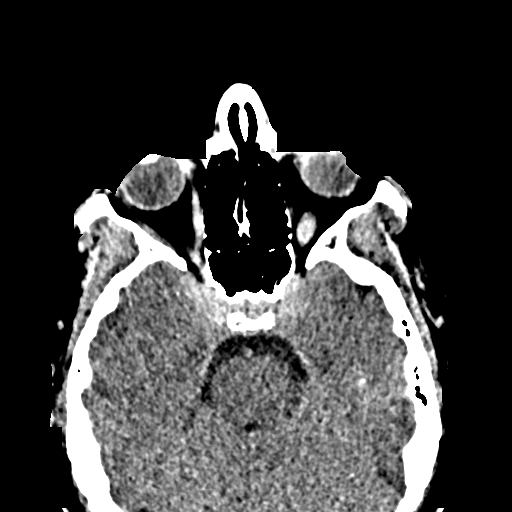
[im 72/131  bone]
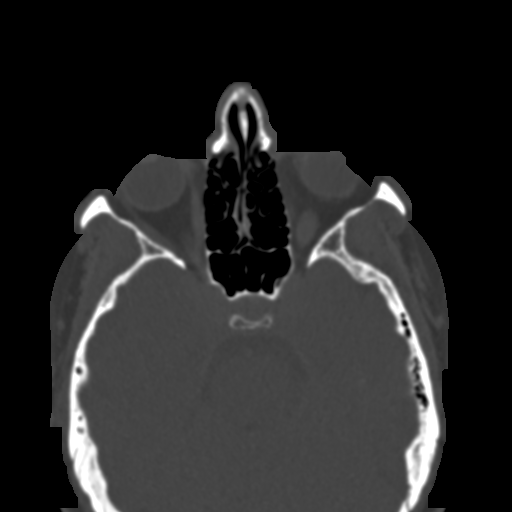
[im 90/131  bone]
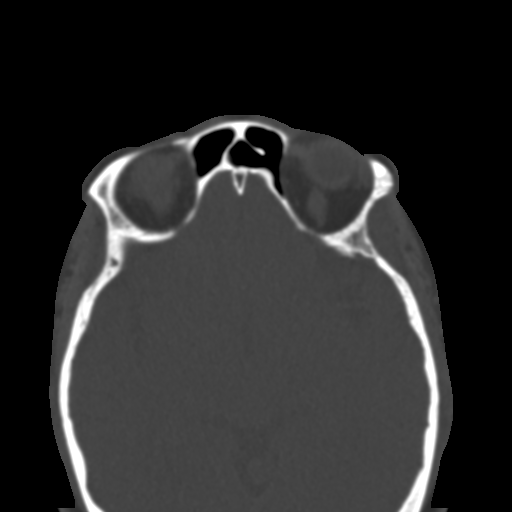
[im 104/131  bone]
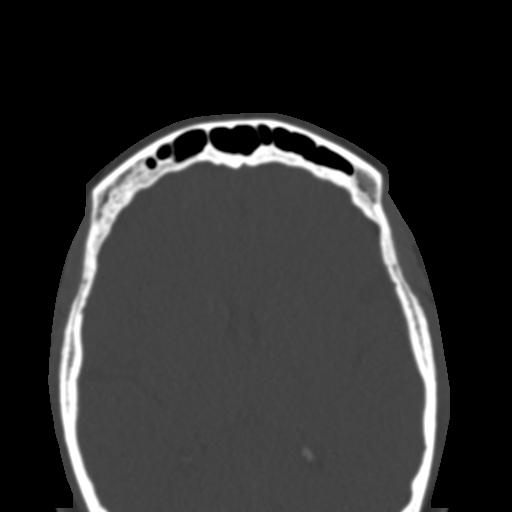
[im 122/131  bone]
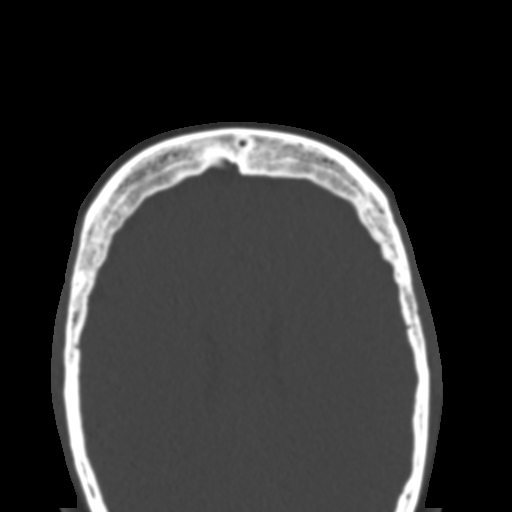

[Series 4: coronal · coronal · 0.27mm/px · 3 of 83 slices shown]
[im 37/83  bone]
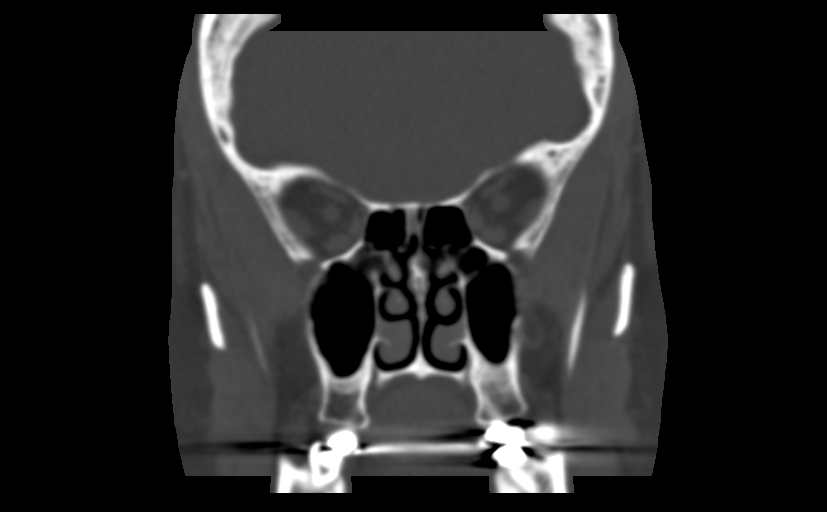
[im 46/83  bone]
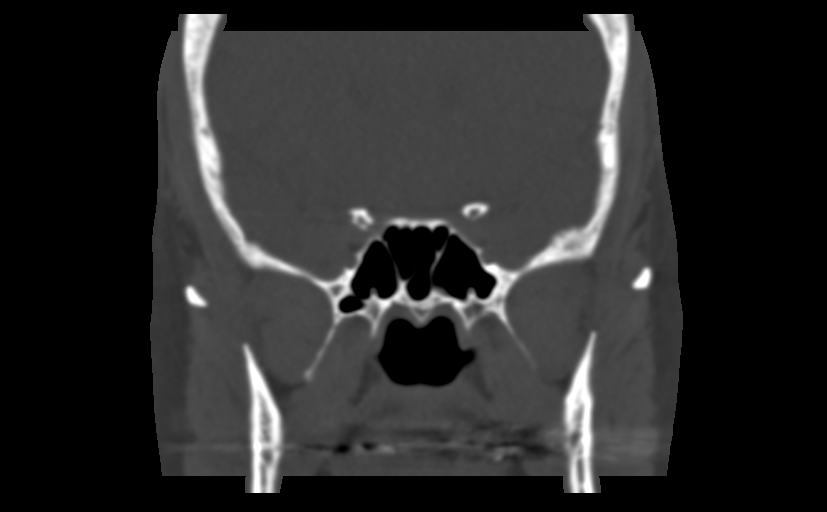
[im 55/83  bone]
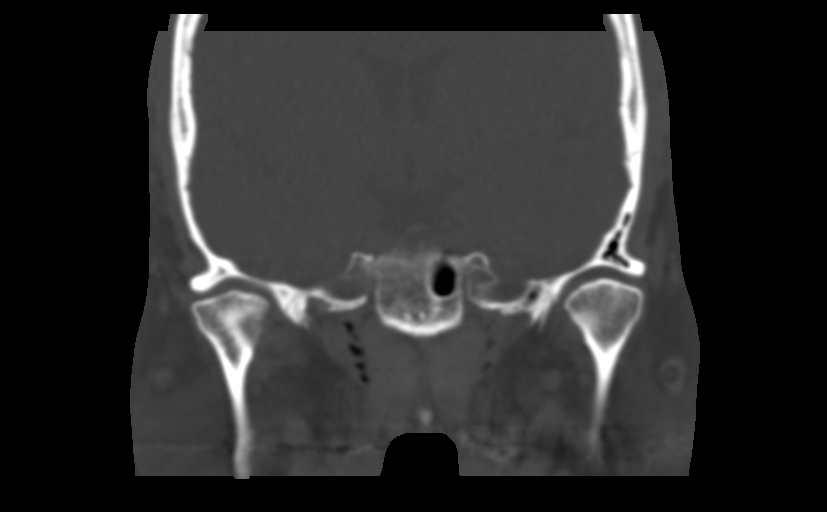

[Series 5: sagittal · sagittal · 0.27mm/px · 3 of 93 slices shown]
[im 31/93  bone]
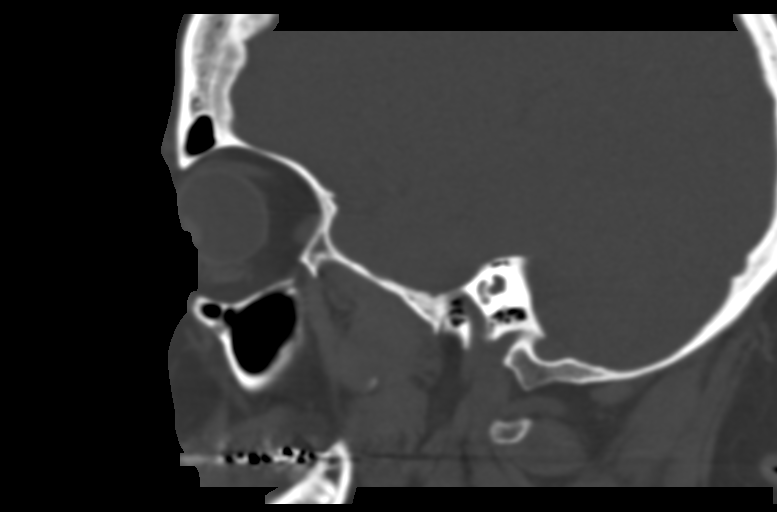
[im 47/93  bone]
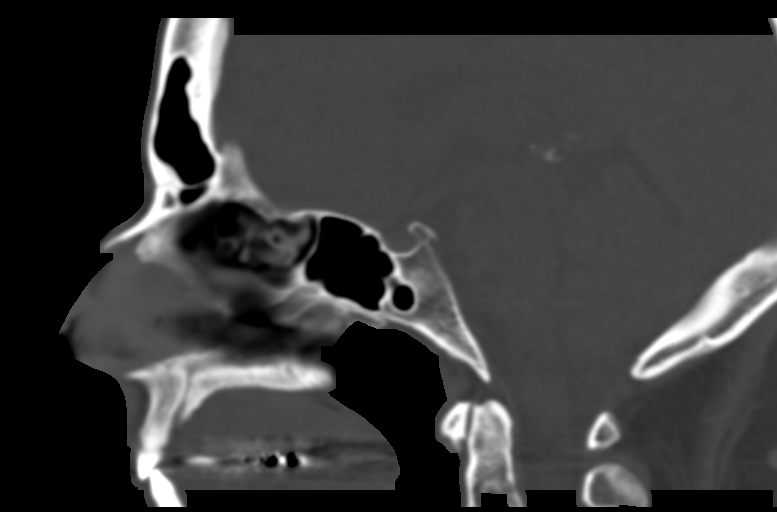
[im 62/93  bone]
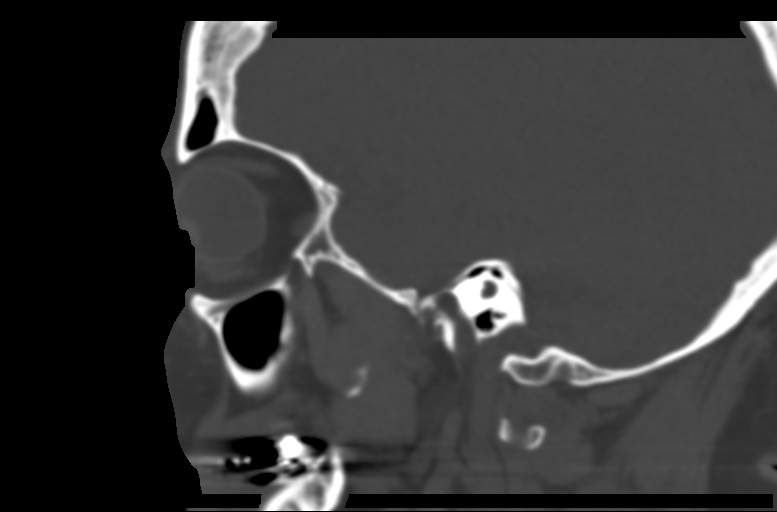

[14 of 47 positions shown; findings below may reference images not displayed]

FINDINGS: Osseous: No acute osseous abnormality about the face. Visualized
dentition within normal limits.

Orbits: Globes and orbital soft tissues within normal limits.

Sinuses: Frontal sinuses are clear as are the frontoethmoidal
recesses. Mild mucoperiosteal thickening throughout the ethmoidal
air cells bilaterally without significant sinus opacification.
Minimal mucosal thickening within the left sphenoid sinus. Right
sphenoid sinus clear. Mild mucosal thickening involves the floor of
the left maxillary sinus. Maxillary sinuses are otherwise clear.
Ostiomeatal units clear. Trace right-to-left nasal septal deviation
with associated bilateral concha bullosa, larger on the right. Nasal
passages clear. No air-fluid levels to suggest acute sinusitis at
this time.

Soft tissues: Visible soft tissues of the face within normal limits.

Limited intracranial: Visualized intracranial contents unremarkable.
Mastoid air cells and middle ear cavities are well pneumatized and
free of fluid.
IMPRESSION: Minimal mucoperiosteal thickening involving the ethmoidal air cells
as well as the left sphenoid and maxillary sinuses. Otherwise clear
sinuses. No findings to suggest acute sinusitis at this time.

## 2019-06-09 ENCOUNTER — Ambulatory Visit: Payer: BC Managed Care – PPO | Attending: Internal Medicine

## 2019-06-09 ENCOUNTER — Other Ambulatory Visit: Payer: Self-pay

## 2019-06-09 DIAGNOSIS — U071 COVID-19: Secondary | ICD-10-CM | POA: Insufficient documentation

## 2019-06-09 DIAGNOSIS — Z20822 Contact with and (suspected) exposure to covid-19: Secondary | ICD-10-CM

## 2019-06-10 LAB — NOVEL CORONAVIRUS, NAA: SARS-CoV-2, NAA: DETECTED — AB

## 2019-07-14 ENCOUNTER — Other Ambulatory Visit: Payer: Self-pay

## 2019-07-14 ENCOUNTER — Ambulatory Visit: Payer: BC Managed Care – PPO | Admitting: Orthopaedic Surgery

## 2019-07-14 ENCOUNTER — Ambulatory Visit: Payer: Self-pay

## 2019-07-14 VITALS — Ht 66.0 in | Wt 205.0 lb

## 2019-07-14 DIAGNOSIS — M25552 Pain in left hip: Secondary | ICD-10-CM

## 2019-07-14 DIAGNOSIS — M25551 Pain in right hip: Secondary | ICD-10-CM | POA: Diagnosis not present

## 2019-07-14 DIAGNOSIS — M7062 Trochanteric bursitis, left hip: Secondary | ICD-10-CM | POA: Diagnosis not present

## 2019-07-14 DIAGNOSIS — M7061 Trochanteric bursitis, right hip: Secondary | ICD-10-CM | POA: Diagnosis not present

## 2019-07-14 MED ORDER — METHYLPREDNISOLONE ACETATE 40 MG/ML IJ SUSP
40.0000 mg | INTRAMUSCULAR | Status: AC | PRN
  Administered 2019-07-14: 10:00:00 40 mg via INTRA_ARTICULAR

## 2019-07-14 MED ORDER — LIDOCAINE HCL 1 % IJ SOLN
3.0000 mL | INTRAMUSCULAR | Status: AC | PRN
  Administered 2019-07-14: 10:00:00 3 mL

## 2019-07-14 NOTE — Progress Notes (Signed)
Office Visit Note   Patient: Valerie George           Date of Birth: 03-Mar-1971           MRN: [id number] Visit Date: 07/14/2019              Requested by: Glenda Chroman, MD [place],  [place] [address] PCP: Glenda Chroman, MD   Assessment & Plan: Visit Diagnoses:  1. Left hip pain   2. Pain in right hip   3. Trochanteric bursitis, left hip   4. Trochanteric bursitis, right hip     Plan: I was able to show her several stretching exercises to try for trochanteric bursitis.  I did offer her a steroid injection in both areas which she agreed to and tolerated well.  I explained the risk and benefits of injections.  She will also try topical Voltaren gel.  If things are not improving she will call us because my neck step would be to set her up for outpatient physical therapy.  All question concerns were answered and addressed.  Follow-Up Instructions: Return if symptoms worsen or fail to improve.   Orders:  Orders Placed This Encounter  Procedures  . Large Joint Inj  . Large Joint Inj  . XR HIPS BILAT W OR W/O PELVIS 2V   No orders of the defined types were placed in this encounter.     Procedures: Large Joint Inj: R greater trochanter on 07/14/2019 9:30 AM Indications: pain and diagnostic evaluation Details: 22 G 1.5 in needle, lateral approach  Arthrogram: No  Medications: 3 mL lidocaine 1 %; 40 mg methylPREDNISolone acetate 40 MG/ML Outcome: tolerated well, no immediate complications Procedure, treatment alternatives, risks and benefits explained, specific risks discussed. Consent was given by the patient. Immediately prior to procedure a time out was called to verify the correct patient, procedure, equipment, support staff and site/side marked as required. Patient was prepped and draped in the usual sterile fashion.   Large Joint Inj: L greater trochanter on 07/14/2019 9:30 AM Indications: pain and diagnostic evaluation Details: 22 G 1.5 in needle, lateral  approach  Arthrogram: No  Medications: 3 mL lidocaine 1 %; 40 mg methylPREDNISolone acetate 40 MG/ML Outcome: tolerated well, no immediate complications Procedure, treatment alternatives, risks and benefits explained, specific risks discussed. Consent was given by the patient. Immediately prior to procedure a time out was called to verify the correct patient, procedure, equipment, support staff and site/side marked as required. Patient was prepped and draped in the usual sterile fashion.       Clinical Data: No additional findings.   Subjective: Chief Complaint  Patient presents with  . Left Hip - Pain  . Right Hip - Pain  Valerie George comes in today with bilateral hip pain with right worse than left.  This pain is over the trochanteric area of both hips.  It has been going on for many years now.  She denies any groin pain at all.  She is not a diabetic.  She denies any recent injuries.  HPI  Review of Systems She currently denies any headache, chest pain, shortness of breath, fever, chills, nausea, vomiting  Objective: Vital Signs: Ht 5\' 6"  (1.676 m)   Wt 205 lb (93 kg)   BMI 33.09 kg/m   Physical Exam She is alert and oriented x3 and in no acute distress.  She does not walk with a limp. Ortho Exam Examination of both hips shows a move smoothly and  fluidly with no pain in the groin at all.  There is no pain on extremes of rotation in the hip joint.  When I have her lay in a supine position her leg lengths are equal.  Her pain is only to palpation over the trochanteric area and IT band when I had her lay lateral on each side. Specialty Comments:  No specialty comments available.  Imaging: XR HIPS BILAT W OR W/O PELVIS 2V  Result Date: 07/14/2019 A standing low AP pelvis and a lateral both hips show no acute findings.  The hip ball-and-socket is normal on each side with adequate space and no evidence of arthritis.  There is no cortical irregularities around the trochanteric area on  either side.    PMFS History: There are no problems to display for this patient.  Past Medical History:  Diagnosis Date  . Arthritis   . Depression   . GERD (gastroesophageal reflux disease)   . Hypothyroidism   . PONV (postoperative nausea and vomiting)     No family history on file.  Past Surgical History:  Procedure Laterality Date  . BREAST SURGERY     implants  . CHOLECYSTECTOMY    . NASAL SEPTOPLASTY W/ TURBINOPLASTY Bilateral 07/30/2018   Procedure: NASAL SEPTOPLASTY WITH BILATERAL TURBINATE REDUCTION;  Surgeon: Newman Pies, MD;  Location: [facility];  Service: ENT;  Laterality: Bilateral;   Social History   Occupational History  . Not on file  Tobacco Use  . Smoking status: Former Smoker    Years: 15.00    Types: Cigarettes    Quit date: 2007    Years since quitting: 14.1  . Smokeless tobacco: Never Used  Substance and Sexual Activity  . Alcohol use: Yes    Comment: occas  . Drug use: Never  . Sexual activity: Not on file

## 2020-08-31 ENCOUNTER — Encounter: Payer: Self-pay | Admitting: Internal Medicine

## 2020-09-09 ENCOUNTER — Ambulatory Visit: Payer: BC Managed Care – PPO | Admitting: Orthopaedic Surgery

## 2020-10-13 ENCOUNTER — Ambulatory Visit: Payer: BC Managed Care – PPO | Admitting: Gastroenterology

## 2020-10-16 ENCOUNTER — Encounter: Payer: Self-pay | Admitting: Gastroenterology

## 2020-10-16 NOTE — Progress Notes (Deleted)
Referring Provider: Carlisle Beers, DO Primary Care Physician:  Carlisle Beers, DO Primary Gastroenterologist:  Dr. Jena Gauss  No chief complaint on file.   HPI:   Valerie George is a 50 y.o. female presenting today at the request of Riddick, Gershon Mussel, DO for consult colonoscopy.  Office visit due to increased sedation with last colonoscopy.  EGD 06/04/2001: Ungulating but otherwise normal-appearing Z-line and normal esophagus s/p dilation with 54 French Maloney dilator, small hiatal hernia, otherwise normal exam. EGD 04/29/2003: Normal examined esophagus, stomach, D1, and D2. Colonoscopy 04/29/2003: Normal exam.  Today:    Past Medical History:  Diagnosis Date  . Arthritis   . Depression   . GERD (gastroesophageal reflux disease)   . Hypothyroidism   . PONV (postoperative nausea and vomiting)     Past Surgical History:  Procedure Laterality Date  . BREAST SURGERY     implants  . CHOLECYSTECTOMY    . NASAL SEPTOPLASTY W/ TURBINOPLASTY Bilateral 07/30/2018   Procedure: NASAL SEPTOPLASTY WITH BILATERAL TURBINATE REDUCTION;  Surgeon: Newman Pies, MD;  Location: [facility];  Service: ENT;  Laterality: Bilateral;    Current Outpatient Medications  Medication Sig Dispense Refill  . drospirenone-ethinyl estradiol (YASMIN,ZARAH,SYEDA) 3-0.03 MG tablet Take 1 tablet by mouth daily.    Marland Kitchen FLUoxetine (PROZAC) 20 MG tablet Take 20 mg by mouth daily.    Marland Kitchen levothyroxine (SYNTHROID, LEVOTHROID) 125 MCG tablet Take 125 mcg by mouth daily before breakfast.    . ondansetron (ZOFRAN) 4 MG tablet Take 1 tablet (4 mg total) by mouth every 6 (six) hours as needed for nausea or vomiting. 15 tablet 2  . oxyCODONE-acetaminophen (PERCOCET) 5-325 MG tablet Take 1 tablet by mouth every 4 (four) hours as needed for severe pain. 15 tablet 0  . valACYclovir (VALTREX) 500 MG tablet Take 500 mg by mouth 2 (two) times daily.     No current facility-administered  medications for this visit.    Allergies as of 10/18/2020  . (No Known Allergies)    No family history on file.  Social History   Socioeconomic History  . Marital status: Married    Spouse name: Not on file  . Number of children: Not on file  . Years of education: Not on file  . Highest education level: Not on file  Occupational History  . Not on file  Tobacco Use  . Smoking status: Former Smoker    Years: 15.00    Types: Cigarettes    Quit date: 2007    Years since quitting: 15.3  . Smokeless tobacco: Never Used  Substance and Sexual Activity  . Alcohol use: Yes    Comment: occas  . Drug use: Never  . Sexual activity: Not on file  Other Topics Concern  . Not on file  Social History Narrative  . Not on file   Social Determinants of Health   Financial Resource Strain: Not on file  Food Insecurity: Not on file  Transportation Needs: Not on file  Physical Activity: Not on file  Stress: Not on file  Social Connections: Not on file  Intimate Partner Violence: Not on file    Review of Systems: Gen: Denies any fever, chills, fatigue, weight loss, lack of appetite.  CV: Denies chest pain, heart palpitations, peripheral edema, syncope.  Resp: Denies shortness of breath at rest or with exertion. Denies wheezing or cough.  GI: Denies dysphagia or odynophagia. Denies jaundice, hematemesis, fecal incontinence. GU : Denies urinary burning, urinary frequency, urinary  hesitancy MS: Denies joint pain, muscle weakness, cramps, or limitation of movement.  Derm: Denies rash, itching, dry skin Psych: Denies depression, anxiety, memory loss, and confusion Heme: Denies bruising, bleeding, and enlarged lymph nodes.  Physical Exam: There were no vitals taken for this visit. General:   Alert and oriented. Pleasant and cooperative. Well-nourished and well-developed.  Head:  Normocephalic and atraumatic. Eyes:  Without icterus, sclera clear and conjunctiva pink.  Ears:  Normal  auditory acuity. Nose:  No deformity, discharge,  or lesions. Mouth:  No deformity or lesions, oral mucosa pink.  Neck:  Supple, without mass or thyromegaly. Lungs:  Clear to auscultation bilaterally. No wheezes, rales, or rhonchi. No distress.  Heart:  S1, S2 present without murmurs appreciated.  Abdomen:  +BS, soft, non-tender and non-distended. No HSM noted. No guarding or rebound. No masses appreciated.  Rectal:  Deferred  Msk:  Symmetrical without gross deformities. Normal posture. Pulses:  Normal pulses noted. Extremities:  Without clubbing or edema. Neurologic:  Alert and  oriented x4;  grossly normal neurologically. Skin:  Intact without significant lesions or rashes. Cervical Nodes:  No significant cervical adenopathy. Psych:  Alert and cooperative. Normal mood and affect.

## 2020-10-18 ENCOUNTER — Ambulatory Visit: Payer: BC Managed Care – PPO | Admitting: Gastroenterology

## 2022-03-14 ENCOUNTER — Encounter: Payer: Self-pay | Admitting: Internal Medicine

## 2022-03-29 ENCOUNTER — Encounter: Payer: Self-pay | Admitting: Internal Medicine

## 2022-03-30 ENCOUNTER — Encounter: Payer: Self-pay | Admitting: Internal Medicine

## 2022-03-30 ENCOUNTER — Ambulatory Visit: Payer: BC Managed Care – PPO | Attending: Internal Medicine | Admitting: Internal Medicine

## 2022-03-30 VITALS — BP 128/86 | HR 77 | Ht 65.0 in | Wt 213.0 lb

## 2022-03-30 DIAGNOSIS — I5030 Unspecified diastolic (congestive) heart failure: Secondary | ICD-10-CM | POA: Diagnosis not present

## 2022-03-30 DIAGNOSIS — G4733 Obstructive sleep apnea (adult) (pediatric): Secondary | ICD-10-CM | POA: Diagnosis not present

## 2022-03-30 DIAGNOSIS — Z79899 Other long term (current) drug therapy: Secondary | ICD-10-CM | POA: Diagnosis not present

## 2022-03-30 DIAGNOSIS — I080 Rheumatic disorders of both mitral and aortic valves: Secondary | ICD-10-CM | POA: Insufficient documentation

## 2022-03-30 MED ORDER — FUROSEMIDE 20 MG PO TABS: 20.0000 mg | ORAL_TABLET | Freq: Every day | ORAL | 1 refills | Status: DC

## 2022-03-30 MED ORDER — POTASSIUM CHLORIDE CRYS ER 20 MEQ PO TBCR: 20.0000 meq | EXTENDED_RELEASE_TABLET | Freq: Every day | ORAL | 1 refills | Status: DC

## 2022-03-30 NOTE — Progress Notes (Signed)
Cardiology Office Note  Date: 03/30/2022   ID: Valerie George, DOB 1971/03/14, MRN [id number]  PCP:  Carlisle Beers, DO  Cardiologist:  Marjo Bicker, MD Electrophysiologist:  None   Reason for Office Visit: Evaluation of shortness of breath at the request of her PCP   History of Present Illness: Valerie George is a 51 y.o. female with no PMH was referred to cardiology clinic for evaluation of shortness of breath at the request of her PCP.  Patient stated that she has been having SOB with household chores for quite a while however she noticed it worsening recently associated with palpitations. Denied leg swelling.  Denied dizziness, presyncope, syncope, angina. Patient was recently diagnosed with obstructive sleep apnea and is awaiting a CPAP machine. Denies smoking cigarettes, illicit drug abuse or alcohol use. No family history premature is ASCVD.  Past Medical History:  Diagnosis Date   Arthritis    Depression    GERD (gastroesophageal reflux disease)    Hypothyroidism    PONV (postoperative nausea and vomiting)     Past Surgical History:  Procedure Laterality Date   BREAST SURGERY     implants   CHOLECYSTECTOMY     COLONOSCOPY  04/29/2003   Dr. Jena Gauss; normal exam.   ESOPHAGOGASTRODUODENOSCOPY  04/29/2003   Dr. Jena Gauss; normal exam   ESOPHAGOGASTRODUODENOSCOPY (EGD) WITH ESOPHAGEAL DILATION  06/04/2001   Dr. Jena Gauss; Ungulating but otherwise normal-appearing Z-line and normal esophagus s/p dilation with 54 French Maloney dilator, small hiatal hernia, otherwise normal exam.   NASAL SEPTOPLASTY W/ TURBINOPLASTY Bilateral 07/30/2018   Procedure: NASAL SEPTOPLASTY WITH BILATERAL TURBINATE REDUCTION;  Surgeon: Newman Pies, MD;  Location: [facility];  Service: ENT;  Laterality: Bilateral;    Current Outpatient Medications  Medication Sig Dispense Refill   citalopram (CELEXA) 40 MG tablet Take 40 mg by mouth daily.     drospirenone-ethinyl  estradiol (YASMIN,ZARAH,SYEDA) 3-0.03 MG tablet Take 1 tablet by mouth daily.     furosemide (LASIX) 20 MG tablet Take 1 tablet (20 mg total) by mouth daily. 30 tablet 1   levothyroxine (SYNTHROID, LEVOTHROID) 125 MCG tablet Take 125 mcg by mouth daily before breakfast.     potassium chloride SA (KLOR-CON M) 20 MEQ tablet Take 1 tablet (20 mEq total) by mouth daily. 30 tablet 1   valACYclovir (VALTREX) 500 MG tablet Take 500 mg by mouth as needed.     No current facility-administered medications for this visit.   Allergies:  Patient has no known allergies.   Social History: The patient  reports that she quit smoking about 16 years ago. Her smoking use included cigarettes. She has never used smokeless tobacco. She reports current alcohol use. She reports that she does not use drugs.   Family History: The patient's family history is not on file.   ROS:  Please see the history of present illness. Otherwise, complete review of systems is positive for none.  All other systems are reviewed and negative.   Physical Exam: VS:  BP 128/86   Pulse 77   Ht 5\' 5"  (1.651 m)   Wt 213 lb (96.6 kg)   SpO2 98%   BMI 35.45 kg/m , BMI Body mass index is 35.45 kg/m.  Wt Readings from Last 3 Encounters:  03/30/22 213 lb (96.6 kg)  07/14/19 205 lb (93 kg)  07/30/18 208 lb 1.8 oz (94.4 kg)    General: Patient appears comfortable at rest. HEENT: Conjunctiva and lids normal, oropharynx clear with moist mucosa.  Neck: Supple, no elevated JVP or carotid bruits, no thyromegaly. Lungs: Clear to auscultation, nonlabored breathing at rest. Cardiac: Regular rate and rhythm, no S3 or significant systolic murmur, no pericardial rub. Abdomen: Soft, nontender, no hepatomegaly, bowel sounds present, no guarding or rebound. Extremities: No pitting edema, distal pulses 2+. Skin: Warm and dry. Musculoskeletal: No kyphosis. Neuropsychiatric: Alert and oriented x3, affect grossly appropriate.  ECG:  An ECG dated   was  personally reviewed today and demonstrated:  NSR  Recent Labwork: No results found for requested labs within last 365 days.  No results found for: "CHOL", "TRIG", "HDL", "CHOLHDL", "VLDL", "LDLCALC", "LDLDIRECT"  Other Studies Reviewed Today: Echo in 2023 (from PCP) Normal LVEF, grade 1 diastolic dysfunction at rest Mild MR and mild AR  Assessment and Plan: Patient is a 51 year old F with no PMH was referred to cardiology clinic for evaluation of SOB per request of her PCP.  #HFpEF Plan -Patient has grade 1 diastolic dysfunction on echo at rest which might have worsened with exertion. -Start Lasix 20 mg once daily. Urine sodium in 5 days to assess response to diuresis. 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  > 50% of time was spent in direct patient care.      Medication Adjustments/Labs and Tests Ordered: Current medicines are reviewed at length with the patient today.  Concerns regarding medicines are outlined above.   Tests Ordered: Orders Placed This Encounter  Procedures   Sodium, urine, random    Medication Changes: Meds ordered this encounter  Medications   furosemide (LASIX) 20 MG tablet    Sig: Take 1 tablet (20 mg total) by mouth daily.    Dispense:  30 tablet    Refill:  1    03/30/2022 NEW   potassium chloride SA (KLOR-CON M) 20 MEQ tablet    Sig: Take 1 tablet (20 mEq total) by mouth daily.    Dispense:  30 tablet    Refill:  1    03/30/2022 NEW    Disposition:  Follow up  1 month  Signed Tremain Rucinski Fidel Levy, MD, 03/30/2022 12:23 PM    [facility], [place], [place] [address]

## 2022-03-30 NOTE — Patient Instructions (Addendum)
Medication Instructions:  Your physician has recommended you make the following change in your medication:  Start lasix 20 mg daily Start potassium 20 meq daily Continue other medications the same  Labwork: Urine Sodium 04/04/2022 at [facility]  Testing/Procedures: none  Follow-Up: Your physician recommends that you schedule a follow-up appointment in: 1 month  Any Other Special Instructions Will Be Listed Below (If Applicable).  If you need a refill on your cardiac medications before your next appointment, please call your pharmacy.

## 2022-03-30 NOTE — Addendum Note (Signed)
Addended by: Laurine Blazer on: 03/30/2022 12:56 PM   Modules accepted: Orders

## 2022-05-08 ENCOUNTER — Emergency Department (HOSPITAL_COMMUNITY)
Admission: EM | Admit: 2022-05-08 | Discharge: 2022-05-08 | Disposition: A | Discharge status: Home / Self Care | Payer: BC Managed Care – PPO | Attending: Emergency Medicine | Admitting: Emergency Medicine

## 2022-05-08 ENCOUNTER — Emergency Department (HOSPITAL_COMMUNITY): Payer: BC Managed Care – PPO

## 2022-05-08 ENCOUNTER — Encounter (HOSPITAL_COMMUNITY): Payer: Self-pay

## 2022-05-08 ENCOUNTER — Ambulatory Visit: Payer: BC Managed Care – PPO | Admitting: Internal Medicine

## 2022-05-08 ENCOUNTER — Other Ambulatory Visit: Payer: Self-pay

## 2022-05-08 DIAGNOSIS — K1120 Sialoadenitis, unspecified: Secondary | ICD-10-CM | POA: Insufficient documentation

## 2022-05-08 DIAGNOSIS — Z1152 Encounter for screening for COVID-19: Secondary | ICD-10-CM | POA: Diagnosis not present

## 2022-05-08 DIAGNOSIS — E039 Hypothyroidism, unspecified: Secondary | ICD-10-CM | POA: Diagnosis not present

## 2022-05-08 DIAGNOSIS — R059 Cough, unspecified: Secondary | ICD-10-CM | POA: Diagnosis present

## 2022-05-08 DIAGNOSIS — Z79899 Other long term (current) drug therapy: Secondary | ICD-10-CM | POA: Insufficient documentation

## 2022-05-08 DIAGNOSIS — J101 Influenza due to other identified influenza virus with other respiratory manifestations: Secondary | ICD-10-CM | POA: Insufficient documentation

## 2022-05-08 LAB — CBC WITH DIFFERENTIAL/PLATELET
Abs Immature Granulocytes: 0.02 10*3/uL (ref 0.00–0.07)
Basophils Absolute: 0 10*3/uL (ref 0.0–0.1)
Basophils Relative: 1 %
Eosinophils Absolute: 0 10*3/uL (ref 0.0–0.5)
Eosinophils Relative: 0 %
HCT: 35.8 % — ABNORMAL LOW (ref 36.0–46.0)
Hemoglobin: 12.1 g/dL (ref 12.0–15.0)
Immature Granulocytes: 0 %
Lymphocytes Relative: 24 %
Lymphs Abs: 1.7 10*3/uL (ref 0.7–4.0)
MCH: 29.4 pg (ref 26.0–34.0)
MCHC: 33.8 g/dL (ref 30.0–36.0)
MCV: 87.1 fL (ref 80.0–100.0)
Monocytes Absolute: 0.7 10*3/uL (ref 0.1–1.0)
Monocytes Relative: 10 %
Neutro Abs: 4.5 10*3/uL (ref 1.7–7.7)
Neutrophils Relative %: 65 %
Platelets: 274 10*3/uL (ref 150–400)
RBC: 4.11 MIL/uL (ref 3.87–5.11)
RDW: 13.3 % (ref 11.5–15.5)
WBC: 6.9 10*3/uL (ref 4.0–10.5)
nRBC: 0 % (ref 0.0–0.2)

## 2022-05-08 LAB — COMPREHENSIVE METABOLIC PANEL
ALT: 43 U/L (ref 0–44)
AST: 41 U/L (ref 15–41)
Albumin: 2.9 g/dL — ABNORMAL LOW (ref 3.5–5.0)
Alkaline Phosphatase: 67 U/L (ref 38–126)
Anion gap: 12 (ref 5–15)
BUN: 7 mg/dL (ref 6–20)
CO2: 25 mmol/L (ref 22–32)
Calcium: 8.4 mg/dL — ABNORMAL LOW (ref 8.9–10.3)
Chloride: 98 mmol/L (ref 98–111)
Creatinine, Ser: 0.84 mg/dL (ref 0.44–1.00)
GFR, Estimated: >60 mL/min (ref >60)
Glucose, Bld: 94 mg/dL (ref 70–99)
Potassium: 3.1 mmol/L — ABNORMAL LOW (ref 3.5–5.1)
Sodium: 135 mmol/L (ref 135–145)
Total Bilirubin: 0.2 mg/dL — ABNORMAL LOW (ref 0.3–1.2)
Total Protein: 6.8 g/dL (ref 6.5–8.1)

## 2022-05-08 LAB — RESP PANEL BY RT-PCR (RSV, FLU A&B, COVID)  RVPGX2
Influenza A by PCR: POSITIVE — AB
Influenza B by PCR: NEGATIVE
Resp Syncytial Virus by PCR: NEGATIVE
SARS Coronavirus 2 by RT PCR: NEGATIVE

## 2022-05-08 MED ORDER — KETOROLAC TROMETHAMINE 15 MG/ML IJ SOLN
15.0000 mg | Freq: Once | INTRAMUSCULAR | Status: AC
  Administered 2022-05-08: 15 mg via INTRAVENOUS
  Filled 2022-05-08: qty 1

## 2022-05-08 MED ORDER — ACETAMINOPHEN 500 MG PO TABS
1000.0000 mg | ORAL_TABLET | Freq: Once | ORAL | Status: AC
  Administered 2022-05-08: 1000 mg via ORAL
  Filled 2022-05-08: qty 2

## 2022-05-08 MED ORDER — SODIUM CHLORIDE 0.9 % IV SOLN
3.0000 g | Freq: Once | INTRAVENOUS | Status: AC
  Administered 2022-05-08: 3 g via INTRAVENOUS
  Filled 2022-05-08: qty 8

## 2022-05-08 MED ORDER — LACTATED RINGERS IV BOLUS
1000.0000 mL | Freq: Once | INTRAVENOUS | Status: AC
  Administered 2022-05-08: 1000 mL via INTRAVENOUS

## 2022-05-08 MED ORDER — SODIUM CHLORIDE 0.9 % IV SOLN: 1.0000 g | Freq: Once | INTRAVENOUS | Status: DC

## 2022-05-08 MED ORDER — IOHEXOL 350 MG/ML SOLN
75.0000 mL | Freq: Once | INTRAVENOUS | Status: AC | PRN
  Administered 2022-05-08: 75 mL via INTRAVENOUS

## 2022-05-08 MED ORDER — AMOXICILLIN-POT CLAVULANATE 875-125 MG PO TABS: 1.0000 | ORAL_TABLET | Freq: Two times a day (BID) | ORAL | 0 refills | Status: DC

## 2022-05-08 NOTE — Discharge Instructions (Signed)
While in the emergency department you received a CT which showed left parotitis.  You are also found to be influenza A positive.  While in the emergency department you were given a dose of IV antibiotics and discharged with a prescription for Augmentin.  Please take this medication twice daily until completed and follow-up with your ENT tomorrow.

## 2022-05-08 NOTE — ED Provider Notes (Signed)
[facility] EMERGENCY DEPARTMENT Provider Note   CSN: [id number] Arrival date & time: 05/08/22  0355     History  Chief Complaint  Patient presents with   Cough   Fever    Valerie George is a 51 y.o. female.   Cough Associated symptoms: fever   Fever Associated symptoms: cough    The patient is a 51 year old female with past medical history of hypothyroidism, fibromyalgia presenting for evaluation of fever, generalized bodyaches, cough, headache, and left-sided facial swelling.  The patient states that she developed upper respiratory symptoms to include daily fevers, cough, myalgias 7 days ago.  She went to her primary care doctor 2 days ago and was diagnosed with an upper respiratory infection; she was COVID and influenza negative at that time.  She was started on doxycycline and a course of steroids.  Yesterday, the patient developed left-sided facial swelling prompting her to present to an urgent care.  She was switched from doxycycline to Keflex.  The patient is presenting today for persistence of her fever, cough, myalgias and significant worsening of her left-sided facial swelling and pain overnight.  The patient endorses pain with opening her mouth but denies difficulty swallowing or concern for airway compromise.     Home Medications Prior to Admission medications   Medication Sig Start Date End Date Taking? Authorizing Provider  amoxicillin-clavulanate (AUGMENTIN) 875-125 MG tablet Take 1 tablet by mouth every 12 (twelve) hours. 05/08/22  Yes Knox Saliva, MD  citalopram (CELEXA) 40 MG tablet Take 40 mg by mouth daily.    [provider]  drospirenone-ethinyl estradiol (YASMIN,ZARAH,SYEDA) 3-0.03 MG tablet Take 1 tablet by mouth daily.    [provider]  furosemide (LASIX) 20 MG tablet Take 1 tablet (20 mg total) by mouth daily. 03/30/22   Mallipeddi, Vishnu P, MD  levothyroxine (SYNTHROID, LEVOTHROID) 125 MCG tablet Take 125 mcg by  mouth daily before breakfast.    [provider]  potassium chloride SA (KLOR-CON M) 20 MEQ tablet Take 1 tablet (20 mEq total) by mouth daily. 03/30/22   Mallipeddi, Vishnu P, MD  valACYclovir (VALTREX) 500 MG tablet Take 500 mg by mouth as needed.    [provider]      Allergies    Patient has no known allergies.    Review of Systems   Review of Systems  Constitutional:  Positive for fever.  Respiratory:  Positive for cough.    See HPI Physical Exam Updated Vital Signs BP (!) 136/93 (BP Location: Left Arm)   Pulse 93   Temp 100.2 F (37.9 C) (Oral)   Resp 20   Ht 5\' 6"  (1.676 m)   Wt 93.9 kg   SpO2 94%   BMI 33.41 kg/m  Physical Exam Vitals and nursing note reviewed.  Constitutional:      General: She is not in acute distress.    Appearance: She is well-developed.  HENT:     Head: Normocephalic and atraumatic.     Comments: Left facial swelling with overlying erythema around the angle of the jaw.    Nose: Nose normal.     Mouth/Throat:     Comments: 1 finger trismus.  Uvula midline.  No crypt abscesses Eyes:     Conjunctiva/sclera: Conjunctivae normal.  Cardiovascular:     Rate and Rhythm: Normal rate and regular rhythm.     Heart sounds: No murmur heard. Pulmonary:     Effort: Pulmonary effort is normal. No respiratory distress.  Breath sounds: Normal breath sounds.  Abdominal:     Palpations: Abdomen is soft.     Tenderness: There is no abdominal tenderness.  Musculoskeletal:        General: No swelling.     Cervical back: Neck supple.  Skin:    General: Skin is warm and dry.     Capillary Refill: Capillary refill takes less than 2 seconds.  Neurological:     General: No focal deficit present.     Mental Status: She is alert and oriented to person, place, and time.     Cranial Nerves: No cranial nerve deficit.     Sensory: No sensory deficit.     Motor: No weakness.     Coordination: Coordination normal.  Psychiatric:        Mood  and Affect: Mood normal.     ED Results / Procedures / Treatments   Labs (all labs ordered are listed, but only abnormal results are displayed) Labs Reviewed  RESP PANEL BY RT-PCR (RSV, FLU A&B, COVID)  RVPGX2 - Abnormal; Notable for the following components:      Result Value   Influenza A by PCR POSITIVE (*)    All other components within normal limits  CBC WITH DIFFERENTIAL/PLATELET - Abnormal; Notable for the following components:   HCT 35.8 (*)    All other components within normal limits  COMPREHENSIVE METABOLIC PANEL - Abnormal; Notable for the following components:   Potassium 3.1 (*)    Calcium 8.4 (*)    Albumin 2.9 (*)    Total Bilirubin 0.2 (*)    All other components within normal limits    EKG None  Radiology CT Soft Tissue Neck W Contrast  Result Date: 05/08/2022 CLINICAL DATA:  Soft tissue infection suspected.  Parotiditis EXAM: CT NECK WITH CONTRAST TECHNIQUE: Multidetector CT imaging of the neck was performed using the standard protocol following the bolus administration of intravenous contrast. RADIATION DOSE REDUCTION: This exam was performed according to the departmental dose-optimization program which includes automated exposure control, adjustment of the mA and/or kV according to patient size and/or use of iterative reconstruction technique. CONTRAST:  92mL OMNIPAQUE IOHEXOL 350 MG/ML SOLN COMPARISON:  Maxillofacial CT 05/24/2018 FINDINGS: Pharynx and larynx: No evidence of inflammation or mass. Salivary glands: Expansion and stranding with increased density at the left parotid. No obstructing process is seen as permitted by dental amalgam Thyroid: Symmetric atrophy Lymph nodes: Expected enlargement of upper cervical lymph nodes in the setting. Vascular: No acute finding Limited intracranial: Negative Visualized orbits: Negative Mastoids and visualized paranasal sinuses: Patchy mucosal thickening in the paranasal sinuses with maxillary and sphenoid sinus fluid  levels. Skeleton: No acute or aggressive finding. Upper chest: Clear apical lungs IMPRESSION: Left parotiditis without abscess or calculus. Electronically Signed   By: Tiburcio Pea M.D.   On: 05/08/2022 07:45    Procedures Procedures    Medications Ordered in ED Medications  iohexol (OMNIPAQUE) 350 MG/ML injection 75 mL (75 mLs Intravenous Contrast Given 05/08/22 0712)  acetaminophen (TYLENOL) tablet 1,000 mg (1,000 mg Oral Given 05/08/22 1246)  lactated ringers bolus 1,000 mL (1,000 mLs Intravenous New Bag/Given 05/08/22 1323)  ketorolac (TORADOL) 15 MG/ML injection 15 mg (15 mg Intravenous Given 05/08/22 1241)  Ampicillin-Sulbactam (UNASYN) 3 g in sodium chloride 0.9 % 100 mL IVPB (0 g Intravenous Stopped 05/08/22 1323)    ED Course/ Medical Decision Making/ A&P  Medical Decision Making The patient is a 51 year old female with past medical history of hypothyroidism, fibromyalgia presenting for evaluation of fever, generalized bodyaches, cough, headache, and left-sided facial swelling.  The differential diagnosis considered includes: Viral URI, pneumonia, parotitis, PTA, RPA, sialolithiasis.  On initial exam, the patient was hemodynamically stable with a temperature of 100.2 F.  The patient's physical exam was significant for significant left-sided facial swelling, tenderness to palpation, with erythema overlying the angle of the jaw.  She was noted to have 1 finger trismus with signs of dehydration but the uvula was midline and there were no tonsillar exudates as best visualized.  There were no other significant findings on physical exam.  The patient's diagnostic workup, which I independently reviewed and interpreted, included a CT soft tissue neck which showed left parotitis without abscess or calculus; respiratory viral panel which was influenza A positive; CBC with white blood cell count of 6.9 hemoglobin of 12.1; CMP with potassium of 3.1 but no other  significant metabolic abnormalities.  Based on the patient's history, physical exam, and diagnostic workup parotitis and influenza A are favored as likely source of her symptoms.  There were no signs of abscess on imaging and there is low concern for airway compromise at this time.  While in the emergency department, the patient was given a bolus of IV fluids, a dose of IV antibiotics, and antiemetics/analgesics medications.  The patient has an established ENT with which she already follows with.  She was discharged with instructions to touch base for a follow-up appointment within the next 24-48 hours and given strict return precautions to the emergency department.  She was prescribed a outpatient course of antibiotics at the time of her discharge.     Amount and/or Complexity of Data Reviewed External Data Reviewed: notes. Labs: ordered. Decision-making details documented in ED Course. Radiology: ordered and independent interpretation performed. Decision-making details documented in ED Course.  Risk Prescription drug management.  Has admitted Patient's presentation is most consistent with acute complicated illness / injury requiring diagnostic workup.         Final Clinical Impression(s) / ED Diagnoses Final diagnoses:  Parotitis  Influenza A    Rx / DC Orders ED Discharge Orders          Ordered    amoxicillin-clavulanate (AUGMENTIN) 875-125 MG tablet  Every 12 hours        05/08/22 1513              Knox Saliva, MD 05/08/22 1536    Blane Ohara, MD 05/08/22 1605

## 2022-05-08 NOTE — ED Notes (Signed)
Walked patient to the bathroom patient did well 

## 2022-05-08 NOTE — ED Triage Notes (Signed)
Pt arrived complaining of cough, fevers and body aches that started last Monday and noticed some facial swelling that started Saturday on the left side of her face   Pt denies dental pain

## 2022-05-08 NOTE — ED Provider Triage Note (Signed)
Emergency Medicine Provider Triage Evaluation Note  Valerie George , a 51 y.o. female  was evaluated in triage.  Pt complains of left sided facial swelling.  Reports URI symptoms over the past week, negative covid/flu testing and on abx from PCP.  States over the past 24 hours she has had worsening swelling of left side of face.  States it feels swollen/full.  Denies trouble swallowing, no dental pain.  Review of Systems  Positive: Facial swelling Negative: Trouble swallowing, vomiting  Physical Exam  BP (!) 141/89 (BP Location: Right Arm)   Pulse 98   Temp 98.9 F (37.2 C)   Resp 17   SpO2 99%   Gen:   Awake, no distress   Resp:  Normal effort  MSK:   Moves extremities without difficulty  Other:  Swelling to left side of face over parotid gland and anterior left ear, some extension of swelling into the neck, no difficulty swallowing, normal phonation, no stridor  Medical Decision Making  Medically screening exam initiated at 4:17 AM.  Appropriate orders placed.  Annalaura Veltri was informed that the remainder of the evaluation will be completed by another provider, this initial triage assessment does not replace that evaluation, and the importance of remaining in the ED until their evaluation is complete.  Left sided facial swelling.  ? Parotitis/parotid stone.  No airway compromise.  Will obtain labs, CT soft tissue neck.  Covid/flu screen sent.   Garlon Hatchet, PA-C 05/08/22 [phone]

## 2022-09-13 ENCOUNTER — Ambulatory Visit: Payer: BC Managed Care – PPO | Admitting: Internal Medicine

## 2022-11-02 ENCOUNTER — Encounter (INDEPENDENT_AMBULATORY_CARE_PROVIDER_SITE_OTHER): Payer: Self-pay | Admitting: *Deleted

## 2022-12-05 ENCOUNTER — Telehealth (INDEPENDENT_AMBULATORY_CARE_PROVIDER_SITE_OTHER): Payer: Self-pay | Admitting: Gastroenterology

## 2022-12-05 NOTE — Telephone Encounter (Signed)
Who is your primary care physician: Dr.Dhruv Vyas  Reasons for the colonoscopy: Recall  Have you had a colonoscopy before?  Yes 10 years or more  Do you have family history of colon cancer? no  Previous colonoscopy with polyps removed? no  Do you have a history colorectal cancer?   no  Are you diabetic? If yes, Type 1 or Type 2?    no  Do you have a prosthetic or mechanical heart valve? no  Do you have a pacemaker/defibrillator?   no  Have you had endocarditis/atrial fibrillation? no  Have you had joint replacement within the last 12 months?  no  Do you tend to be constipated or have to use laxatives? yes  Do you have any history of drugs or alchohol?  no  Do you use supplemental oxygen?  no  Have you had a stroke or heart attack within the last 6 months? no  Do you take weight loss medication?  no  For female patients: have you had a hysterectomy?  no                                     are you post menopausal?       no                                            do you still have your menstrual cycle? no      Do you take any blood-thinning medications such as: (aspirin, warfarin, Plavix, Aggrenox)  no  If yes we need the name, milligram, dosage and who is prescribing doctor  Current Outpatient Medications on File Prior to Visit  Medication Sig Dispense Refill   esomeprazole (NEXIUM) 40 MG capsule Take 40 mg by mouth 2 (two) times daily.     FLUoxetine (PROZAC) 40 MG capsule Take 40 mg by mouth daily.     levothyroxine (SYNTHROID, LEVOTHROID) 125 MCG tablet Take 125 mcg by mouth daily before breakfast.     valACYclovir (VALTREX) 500 MG tablet Take 500 mg by mouth as needed.     No current facility-administered medications on file prior to visit.    No Known Allergies   Pharmacy: [facility] Name: Ezequiel Essex  Best number where you can be reached: [phone]

## 2022-12-05 NOTE — Telephone Encounter (Signed)
Pt contacted. Pt would like to schedule in August. Will call once I have August schedule.

## 2023-01-01 MED ORDER — PEG 3350-KCL-NA BICARB-NACL 420 G PO SOLR: 4000.0000 mL | Freq: Once | ORAL | 0 refills | Status: AC

## 2023-01-01 NOTE — Telephone Encounter (Signed)
Pt contacted and scheduled for 01/25/23 at 9:30am. Instructions mailed to patient. Prep sent to pharmacy. Will call or mail letter with pre op appt information.

## 2023-01-01 NOTE — Addendum Note (Signed)
Addended by: Marlowe Shores on: 01/01/2023 09:03 AM   Modules accepted: Orders

## 2023-01-02 ENCOUNTER — Encounter (INDEPENDENT_AMBULATORY_CARE_PROVIDER_SITE_OTHER): Payer: Self-pay

## 2023-01-08 NOTE — Telephone Encounter (Signed)
This patient called in and left VM stating this prep in the past has made her sick and has done tablet prep in past.   Dr. Tasia Catchings, is she okay to have sutab pill prep? Thanks!   [facility] in [place]

## 2023-01-09 ENCOUNTER — Encounter: Payer: Self-pay | Admitting: *Deleted

## 2023-01-09 MED ORDER — SUTAB 1479-225-188 MG PO TABS: ORAL_TABLET | ORAL | 0 refills | Status: AC

## 2023-01-09 NOTE — Telephone Encounter (Signed)
Called pt and she is aware. Rx sent in. Advised will send instructions to her [facility] and if she can't get in to let us know as she most likely won't get instructions in time in mail.

## 2023-01-09 NOTE — Addendum Note (Signed)
Addended by: Armstead Peaks on: 01/09/2023 08:16 AM   Modules accepted: Orders

## 2023-01-17 ENCOUNTER — Encounter (INDEPENDENT_AMBULATORY_CARE_PROVIDER_SITE_OTHER): Payer: Self-pay | Admitting: *Deleted

## 2023-01-22 ENCOUNTER — Other Ambulatory Visit: Payer: Self-pay

## 2023-01-22 ENCOUNTER — Encounter (HOSPITAL_COMMUNITY): Payer: Self-pay

## 2023-01-22 ENCOUNTER — Encounter (HOSPITAL_COMMUNITY)
Admission: RE | Admit: 2023-01-22 | Discharge: 2023-01-22 | Disposition: A | Discharge status: Home / Self Care | Payer: BC Managed Care – PPO | Source: Ambulatory Visit | Attending: Gastroenterology | Admitting: Gastroenterology

## 2023-01-22 VITALS — Ht 66.0 in | Wt 204.0 lb

## 2023-01-22 DIAGNOSIS — Z01818 Encounter for other preprocedural examination: Secondary | ICD-10-CM

## 2023-01-25 ENCOUNTER — Encounter (HOSPITAL_COMMUNITY)
Admission: RE | Disposition: A | Discharge status: Home / Self Care | Payer: Self-pay | Source: Home / Self Care | Attending: Gastroenterology

## 2023-01-25 ENCOUNTER — Ambulatory Visit (HOSPITAL_COMMUNITY)
Admission: RE | Admit: 2023-01-25 | Discharge: 2023-01-25 | Disposition: A | Discharge status: Home / Self Care | Payer: BC Managed Care – PPO | Attending: Gastroenterology | Admitting: Gastroenterology

## 2023-01-25 ENCOUNTER — Encounter (HOSPITAL_COMMUNITY): Payer: Self-pay

## 2023-01-25 ENCOUNTER — Ambulatory Visit (HOSPITAL_COMMUNITY): Payer: BC Managed Care – PPO | Admitting: Certified Registered"

## 2023-01-25 DIAGNOSIS — K644 Residual hemorrhoidal skin tags: Secondary | ICD-10-CM | POA: Diagnosis not present

## 2023-01-25 DIAGNOSIS — K5730 Diverticulosis of large intestine without perforation or abscess without bleeding: Secondary | ICD-10-CM | POA: Insufficient documentation

## 2023-01-25 DIAGNOSIS — D123 Benign neoplasm of transverse colon: Secondary | ICD-10-CM | POA: Diagnosis not present

## 2023-01-25 DIAGNOSIS — Z87891 Personal history of nicotine dependence: Secondary | ICD-10-CM | POA: Diagnosis not present

## 2023-01-25 DIAGNOSIS — F32A Depression, unspecified: Secondary | ICD-10-CM | POA: Insufficient documentation

## 2023-01-25 DIAGNOSIS — Z1211 Encounter for screening for malignant neoplasm of colon: Secondary | ICD-10-CM | POA: Diagnosis present

## 2023-01-25 DIAGNOSIS — E039 Hypothyroidism, unspecified: Secondary | ICD-10-CM | POA: Insufficient documentation

## 2023-01-25 DIAGNOSIS — D122 Benign neoplasm of ascending colon: Secondary | ICD-10-CM | POA: Insufficient documentation

## 2023-01-25 DIAGNOSIS — K648 Other hemorrhoids: Secondary | ICD-10-CM | POA: Diagnosis not present

## 2023-01-25 DIAGNOSIS — Z01818 Encounter for other preprocedural examination: Secondary | ICD-10-CM

## 2023-01-25 DIAGNOSIS — G4730 Sleep apnea, unspecified: Secondary | ICD-10-CM | POA: Diagnosis not present

## 2023-01-25 DIAGNOSIS — K219 Gastro-esophageal reflux disease without esophagitis: Secondary | ICD-10-CM | POA: Diagnosis not present

## 2023-01-25 DIAGNOSIS — D125 Benign neoplasm of sigmoid colon: Secondary | ICD-10-CM | POA: Diagnosis not present

## 2023-01-25 DIAGNOSIS — K635 Polyp of colon: Secondary | ICD-10-CM

## 2023-01-25 HISTORY — PX: COLONOSCOPY WITH PROPOFOL: SHX5780

## 2023-01-25 HISTORY — PX: POLYPECTOMY: SHX5525

## 2023-01-25 LAB — POCT PREGNANCY, URINE: Preg Test, Ur: NEGATIVE

## 2023-01-25 LAB — HM COLONOSCOPY

## 2023-01-25 SURGERY — COLONOSCOPY WITH PROPOFOL
Anesthesia: General

## 2023-01-25 MED ORDER — PROPOFOL 500 MG/50ML IV EMUL
INTRAVENOUS | Status: DC | PRN
  Administered 2023-01-25: 150 ug/kg/min via INTRAVENOUS

## 2023-01-25 MED ORDER — LACTATED RINGERS IV SOLN: INTRAVENOUS | Status: DC

## 2023-01-25 MED ORDER — PROPOFOL 10 MG/ML IV BOLUS
INTRAVENOUS | Status: DC | PRN
  Administered 2023-01-25: 25 mg via INTRAVENOUS
  Administered 2023-01-25: 50 mg via INTRAVENOUS
  Administered 2023-01-25: 40 mg via INTRAVENOUS
  Administered 2023-01-25: 25 mg via INTRAVENOUS

## 2023-01-25 NOTE — Anesthesia Postprocedure Evaluation (Signed)
Anesthesia Post Note  Patient: Valerie George  Procedure(s) Performed: COLONOSCOPY WITH PROPOFOL POLYPECTOMY  Patient location during evaluation: Phase II Anesthesia Type: General Level of consciousness: awake Pain management: pain level controlled Vital Signs Assessment: post-procedure vital signs reviewed and stable Respiratory status: spontaneous breathing and respiratory function stable Cardiovascular status: blood pressure returned to baseline and stable Postop Assessment: no headache and no apparent nausea or vomiting Anesthetic complications: no Comments: Late entry   No notable events documented.   Last Vitals:  Vitals:   01/25/23 0806 01/25/23 1014  BP: 127/87 (!) 108/97  Pulse: 72 70  Resp: 14 18  Temp: 36.9 C 37.1 C  SpO2: 97% 99%    Last Pain:  Vitals:   01/25/23 1014  TempSrc: Oral  PainSc: 0-No pain                 Windell Norfolk

## 2023-01-25 NOTE — Transfer of Care (Signed)
Immediate Anesthesia Transfer of Care Note  Patient: Valerie George  Procedure(s) Performed: COLONOSCOPY WITH PROPOFOL POLYPECTOMY  Patient Location: PACU  Anesthesia Type:General  Level of Consciousness: awake, alert , oriented, and patient cooperative  Airway & Oxygen Therapy: Patient Spontanous Breathing  Post-op Assessment: Report given to RN, Post -op Vital signs reviewed and stable, and Patient moving all extremities X 4  Post vital signs: Reviewed and stable  Last Vitals:  Vitals Value Taken Time  BP 108/97 01/25/23 1014  Temp 37.1 C 01/25/23 1014  Pulse 70 01/25/23 1014  Resp 18 01/25/23 1014  SpO2 99 % 01/25/23 1014    Last Pain:  Vitals:   01/25/23 1014  TempSrc: Oral  PainSc: 0-No pain         Complications: No notable events documented.

## 2023-01-25 NOTE — Op Note (Signed)
[facility] Patient Name: Valerie George Procedure Date: 01/25/2023 8:15 AM MRN: [id number] Date of Birth: 1970-07-14 Attending MD: Sanjuan Dame , MD, [id number] CSN: [id number] Age: 52 Admit Type: Outpatient Procedure:                Colonoscopy Indications:              Screening for colorectal malignant neoplasm Providers:                Sanjuan Dame, MD, Angelica Ran, Dyann Ruddle,                            Elinor Parkinson Referring MD:              Medicines:                Monitored Anesthesia Care Complications:            No immediate complications. Estimated Blood Loss:     Estimated blood loss: none. Estimated blood loss                            was minimal. Procedure:                Pre-Anesthesia Assessment:                           - Prior to the procedure, a History and Physical                            was performed, and patient medications and                            allergies were reviewed. The patient's tolerance of                            previous anesthesia was also reviewed. The risks                            and benefits of the procedure and the sedation                            options and risks were discussed with the patient.                            All questions were answered, and informed consent                            was obtained. Prior Anticoagulants: The patient has                            taken no anticoagulant or antiplatelet agents. ASA                            Grade Assessment: III - A patient with severe  systemic disease. After reviewing the risks and                            benefits, the patient was deemed in satisfactory                            condition to undergo the procedure.                           After obtaining informed consent, the colonoscope                            was passed under direct vision. Throughout the                            procedure, the  patient's blood pressure, pulse, and                            oxygen saturations were monitored continuously. The                            [id number] scope was introduced through the                            anus and advanced to the the cecum, identified by                            appendiceal orifice and ileocecal valve. The                            colonoscopy was performed without difficulty. The                            patient tolerated the procedure well. The quality                            of the bowel preparation was evaluated using the                            BBPS [facility] Bowel Preparation Scale) with scores                            of: Right Colon = 2 (minor amount of residual                            staining, small fragments of stool and/or opaque                            liquid, but mucosa seen well), Transverse Colon = 2                            (minor amount of residual staining, small fragments  of stool and/or opaque liquid, but mucosa seen                            well) and Left Colon = 2 (minor amount of residual                            staining, small fragments of stool and/or opaque                            liquid, but mucosa seen well). The total BBPS score                            equals 6. The ileocecal valve, appendiceal orifice,                            and rectum were photographed. Scope In: 9:36:05 AM Scope Out: 10:07:17 AM Scope Withdrawal Time: 0 hours 28 minutes 0 seconds  Total Procedure Duration: 0 hours 31 minutes 12 seconds  Findings:      The perianal and digital rectal examinations were normal.      Three sessile polyps were found in the sigmoid colon, transverse colon       and ascending colon. The polyps were 4 to 7 mm in size. These polyps       were removed with a cold snare. Resection and retrieval were complete.      A 9 mm polyp was found in the transverse colon. The polyp  was sessile.       The polyp was removed with a cold snare. Resection and retrieval were       complete. For hemostasis, one hemostatic clip was successfully placed       (MR conditional). Clip manufacturer: [employer]. There was no       bleeding at the end of the procedure.      Non-bleeding external and internal hemorrhoids were found during       retroflexion.      A few diverticula were found in the left colon. Impression:               - Three 4 to 7 mm polyps in the sigmoid colon, in                            the transverse colon and in the ascending colon,                            removed with a cold snare. Resected and retrieved.                           - One 9 mm polyp in the transverse colon, removed                            with a cold snare. Resected and retrieved. Clip (MR                            conditional) was placed. Clip manufacturer: [employer].                           - Non-bleeding external and internal hemorrhoids.                           - Diverticulosis in the left colon. Moderate Sedation:      Per Anesthesia Care Recommendation:           - Patient has a contact number available for                            emergencies. The signs and symptoms of potential                            delayed complications were discussed with the                            patient. Return to normal activities tomorrow.                            Written discharge instructions were provided to the                            patient.                           - Resume previous diet.                           - Continue present medications.                           - Await pathology results.                           - Repeat colonoscopy for surveillance based on                            pathology results.                           - Return to primary care physician as previously                             scheduled. Procedure Code(s):        --- Professional ---                           [id number], Colonoscopy, flexible; with removal of                            tumor(s), polyp(s), or other lesion(s) by snare                            technique Diagnosis Code(s):        --- Professional ---  Z12.11, Encounter for screening for malignant                            neoplasm of colon                           D12.5, Benign neoplasm of sigmoid colon                           D12.3, Benign neoplasm of transverse colon (hepatic                            flexure or splenic flexure)                           D12.2, Benign neoplasm of ascending colon                           K64.8, Other hemorrhoids                           K57.30, Diverticulosis of large intestine without                            perforation or abscess without bleeding CPT copyright 2022 American Medical Association. All rights reserved. The codes documented in this report are preliminary and upon coder review may  be revised to meet current compliance requirements. Sanjuan Dame, MD Sanjuan Dame, MD 01/25/2023 10:13:51 AM This report has been signed electronically. Number of Addenda: 0

## 2023-01-25 NOTE — Anesthesia Preprocedure Evaluation (Signed)
Anesthesia Evaluation  Patient identified by MRN, date of birth, ID band Patient awake    Reviewed: Allergy & Precautions, H&P , NPO status , Patient's Chart, lab work & pertinent test results, reviewed documented beta blocker date and time   History of Anesthesia Complications (+) PONV and history of anesthetic complications  Airway Mallampati: II  TM Distance: >3 FB Neck ROM: full    Dental no notable dental hx.    Pulmonary neg pulmonary ROS, sleep apnea , former smoker   Pulmonary exam normal breath sounds clear to auscultation       Cardiovascular Exercise Tolerance: Good negative cardio ROS  Rhythm:regular Rate:Normal     Neuro/Psych  PSYCHIATRIC DISORDERS  Depression    negative neurological ROS  negative psych ROS   GI/Hepatic negative GI ROS, Neg liver ROS,GERD  ,,  Endo/Other  negative endocrine ROSHypothyroidism    Renal/GU negative Renal ROS  negative genitourinary   Musculoskeletal   Abdominal   Peds  Hematology negative hematology ROS (+)   Anesthesia Other Findings   Reproductive/Obstetrics negative OB ROS                             Anesthesia Physical Anesthesia Plan  ASA: 2  Anesthesia Plan: General   Post-op Pain Management:    Induction:   PONV Risk Score and Plan: Propofol infusion  Airway Management Planned:   Additional Equipment:   Intra-op Plan:   Post-operative Plan:   Informed Consent: I have reviewed the patients History and Physical, chart, labs and discussed the procedure including the risks, benefits and alternatives for the proposed anesthesia with the patient or authorized representative who has indicated his/her understanding and acceptance.     Dental Advisory Given  Plan Discussed with: CRNA  Anesthesia Plan Comments:        Anesthesia Quick Evaluation

## 2023-01-25 NOTE — H&P (Signed)
Primary Care Physician:  Ignatius Specking, MD Primary Gastroenterologist:  Dr. Tasia Catchings  Pre-Procedure History & Physical: HPI:  Valerie George is a 52 y.o. female with past medical history of hypothyroidism, fibromyalgia is here for a colonoscopy for colon cancer screening purposes.  Patient denies any family history of colorectal cancer.  No melena or hematochezia.  No abdominal pain or unintentional weight loss.  No change in bowel habits.  Overall feels well from a GI standpoint.  last colonoscopy 10 years ago  Past Medical History:  Diagnosis Date   Arthritis    Depression    GERD (gastroesophageal reflux disease)    Hypothyroidism    PONV (postoperative nausea and vomiting)     Past Surgical History:  Procedure Laterality Date   BREAST SURGERY     implants   CHOLECYSTECTOMY     COLONOSCOPY  04/29/2003   Dr. Jena Gauss; normal exam.   DILITATION & CURRETTAGE/HYSTROSCOPY WITH NOVASURE ABLATION     ESOPHAGOGASTRODUODENOSCOPY  04/29/2003   Dr. Jena Gauss; normal exam   ESOPHAGOGASTRODUODENOSCOPY (EGD) WITH ESOPHAGEAL DILATION  06/04/2001   Dr. Jena Gauss; Ungulating but otherwise normal-appearing Z-line and normal esophagus s/p dilation with 54 French Maloney dilator, small hiatal hernia, otherwise normal exam.   NASAL SEPTOPLASTY W/ TURBINOPLASTY Bilateral 07/30/2018   Procedure: NASAL SEPTOPLASTY WITH BILATERAL TURBINATE REDUCTION;  Surgeon: Newman Pies, MD;  Location: [facility];  Service: ENT;  Laterality: Bilateral;    Prior to Admission medications   Medication Sig Start Date End Date Taking? Authorizing Provider  esomeprazole (NEXIUM) 40 MG capsule Take 40 mg by mouth 2 (two) times daily.   Yes [provider]  FLUoxetine (PROZAC) 40 MG capsule Take 40 mg by mouth daily.   Yes [provider]  levothyroxine (SYNTHROID, LEVOTHROID) 125 MCG tablet Take 125 mcg by mouth daily before breakfast.   Yes [provider]  Sodium Sulfate-Mag Sulfate-KCl  (SUTAB) [phone] MG TABS As directed 01/09/23  Yes Orman Matsumura, Juanetta Beets, MD  valACYclovir (VALTREX) 500 MG tablet Take 500 mg by mouth as needed.   Yes [provider]    Allergies as of 01/01/2023   (No Known Allergies)    History reviewed. No pertinent family history.  Social History   Socioeconomic History   Marital status: Married    Spouse name: Not on file   Number of children: Not on file   Years of education: Not on file   Highest education level: Not on file  Occupational History   Not on file  Tobacco Use   Smoking status: Former    Current packs/day: 0.00    Types: Cigarettes    Start date: 49    Quit date: 2007    Years since quitting: 17.6   Smokeless tobacco: Never  Vaping Use   Vaping status: Never Used  Substance and Sexual Activity   Alcohol use: Yes    Comment: occas   Drug use: Never   Sexual activity: Not on file  Other Topics Concern   Not on file  Social History Narrative   Not on file   Social Determinants of Health   Financial Resource Strain: Not on file  Food Insecurity: Not on file  Transportation Needs: Not on file  Physical Activity: Not on file  Stress: Not on file  Social Connections: Not on file  Intimate Partner Violence: Not on file    Review of Systems: See HPI, otherwise negative ROS  Physical Exam: Vital signs in last 24 hours: Temp:  [  98.4 F (36.9 C)] 98.4 F (36.9 C) (08/29 0806) Pulse Rate:  [72] 72 (08/29 0806) Resp:  [14] 14 (08/29 0806) BP: (127)/(87) 127/87 (08/29 0806) SpO2:  [97 %] 97 % (08/29 0806) Weight:  [92.5 kg] 92.5 kg (08/29 0805)   General:   Alert,  Well-developed, well-nourished, pleasant and cooperative in NAD Head:  Normocephalic and atraumatic. Eyes:  Sclera clear, no icterus.   Conjunctiva pink. Ears:  Normal auditory acuity. Nose:  No deformity, discharge,  or lesions. Msk:  Symmetrical without gross deformities. Normal posture. Extremities:  Without clubbing or  edema. Neurologic:  Alert and  oriented x4;  grossly normal neurologically. Skin:  Intact without significant lesions or rashes. Psych:  Alert and cooperative. Normal mood and affect.  Impression/Plan: Valerie George is here for a colonoscopy to be performed for colon cancer screening purposes.  The risks of the procedure including infection, bleed, or perforation as well as benefits, limitations, alternatives and imponderables have been reviewed with the patient. Questions have been answered. All parties agreeable.

## 2023-01-25 NOTE — Discharge Instructions (Signed)

## 2023-01-30 ENCOUNTER — Encounter (INDEPENDENT_AMBULATORY_CARE_PROVIDER_SITE_OTHER): Payer: Self-pay | Admitting: *Deleted

## 2023-02-01 LAB — SURGICAL PATHOLOGY

## 2023-02-05 ENCOUNTER — Encounter (INDEPENDENT_AMBULATORY_CARE_PROVIDER_SITE_OTHER): Payer: Self-pay | Admitting: *Deleted

## 2023-02-05 NOTE — Progress Notes (Signed)
I reviewed the pathology results. Ann, can you send her a letter with the findings as described below please? Repeat colonoscopy in 3 years  Thanks,  Vista Lawman, MD Gastroenterology and Hepatology [facility]  ---------------------------------------------------------------------------------------------  [facility] [address], [address], [place], [place] [address] Phone:  [phone]   02/05/23 [place], [place]   Dear Valerie George,  I am writing to inform you that the biopsies taken during your recent endoscopic examination showed:  Tubular Adenoma and hyperplastic polyp  I am writing to let you know the results of your recent colonoscopy.  You had a total of 4 polyps removed. The pathology came back as "tubular adenoma and hyperplastic polyp" These findings are NOT cancer, but had the polyps remained in your colon, they could have turned into cancer.  Given these findings, it is recommended that your next colonoscopy be performed in 3-5 years.  Please call us at [phone] if you have persistent problems or have questions about your condition that have not been fully answered at this time.  Sincerely,  Vista Lawman, MD Gastroenterology and Hepatology

## 2023-02-06 ENCOUNTER — Encounter (HOSPITAL_COMMUNITY): Payer: Self-pay | Admitting: Gastroenterology

## 2023-05-23 ENCOUNTER — Encounter (HOSPITAL_COMMUNITY): Payer: Self-pay

## 2023-05-23 ENCOUNTER — Other Ambulatory Visit: Payer: Self-pay

## 2023-05-23 ENCOUNTER — Emergency Department (HOSPITAL_COMMUNITY): Payer: BC Managed Care – PPO

## 2023-05-23 ENCOUNTER — Emergency Department (HOSPITAL_COMMUNITY)
Admission: EM | Admit: 2023-05-23 | Discharge: 2023-05-24 | Disposition: A | Discharge status: Home / Self Care | Payer: BC Managed Care – PPO | Attending: Emergency Medicine | Admitting: Emergency Medicine

## 2023-05-23 DIAGNOSIS — W19XXXA Unspecified fall, initial encounter: Secondary | ICD-10-CM | POA: Insufficient documentation

## 2023-05-23 DIAGNOSIS — S8992XA Unspecified injury of left lower leg, initial encounter: Secondary | ICD-10-CM | POA: Diagnosis present

## 2023-05-23 DIAGNOSIS — S76309A Unspecified injury of muscle, fascia and tendon of the posterior muscle group at thigh level, unspecified thigh, initial encounter: Secondary | ICD-10-CM

## 2023-05-23 DIAGNOSIS — M7989 Other specified soft tissue disorders: Secondary | ICD-10-CM | POA: Diagnosis not present

## 2023-05-23 DIAGNOSIS — I509 Heart failure, unspecified: Secondary | ICD-10-CM | POA: Insufficient documentation

## 2023-05-23 DIAGNOSIS — T1490XA Injury, unspecified, initial encounter: Secondary | ICD-10-CM

## 2023-05-23 DIAGNOSIS — S76312A Strain of muscle, fascia and tendon of the posterior muscle group at thigh level, left thigh, initial encounter: Secondary | ICD-10-CM | POA: Diagnosis not present

## 2023-05-23 DIAGNOSIS — Y92009 Unspecified place in unspecified non-institutional (private) residence as the place of occurrence of the external cause: Secondary | ICD-10-CM | POA: Insufficient documentation

## 2023-05-23 DIAGNOSIS — S8492XA Injury of unspecified nerve at lower leg level, left leg, initial encounter: Secondary | ICD-10-CM | POA: Diagnosis not present

## 2023-05-23 MED ORDER — IBUPROFEN 600 MG PO TABS: 600.0000 mg | ORAL_TABLET | Freq: Four times a day (QID) | ORAL | 0 refills | Status: AC | PRN

## 2023-05-23 MED ORDER — KETOROLAC TROMETHAMINE 30 MG/ML IJ SOLN
15.0000 mg | Freq: Once | INTRAMUSCULAR | Status: AC
  Administered 2023-05-23: 15 mg via INTRAVENOUS
  Filled 2023-05-23: qty 1

## 2023-05-23 MED ORDER — LIDOCAINE 4 % EX PTCH: 1.0000 | MEDICATED_PATCH | Freq: Two times a day (BID) | CUTANEOUS | 0 refills | Status: AC

## 2023-05-23 MED ORDER — METHOCARBAMOL 500 MG PO TABS: 500.0000 mg | ORAL_TABLET | Freq: Two times a day (BID) | ORAL | 0 refills | Status: AC

## 2023-05-23 MED ORDER — LIDOCAINE 5 % EX PTCH
1.0000 | MEDICATED_PATCH | CUTANEOUS | Status: DC
  Administered 2023-05-24: 1 via TRANSDERMAL
  Filled 2023-05-23: qty 1

## 2023-05-23 MED ORDER — HYDROMORPHONE HCL 1 MG/ML IJ SOLN
1.0000 mg | Freq: Once | INTRAMUSCULAR | Status: AC
  Administered 2023-05-23: 1 mg via INTRAVENOUS
  Filled 2023-05-23: qty 1

## 2023-05-23 MED ORDER — DIAZEPAM 2 MG PO TABS
2.0000 mg | ORAL_TABLET | Freq: Once | ORAL | Status: AC
  Administered 2023-05-23: 2 mg via ORAL
  Filled 2023-05-23: qty 1

## 2023-05-23 MED ORDER — ACETAMINOPHEN 500 MG PO TABS: 500.0000 mg | ORAL_TABLET | Freq: Four times a day (QID) | ORAL | 0 refills | Status: AC | PRN

## 2023-05-23 MED ORDER — OXYCODONE-ACETAMINOPHEN 5-325 MG PO TABS: 1.0000 | ORAL_TABLET | Freq: Three times a day (TID) | ORAL | 0 refills | Status: AC | PRN

## 2023-05-23 NOTE — ED Notes (Signed)
Patient transported to X-ray 

## 2023-05-23 NOTE — ED Provider Notes (Signed)
Care assumed from Dr. Rhunette Croft, patient with fall pending x-rays.  X-rays show no evidence of fracture.  Have independently viewed the images, and agree with radiologist's interpretation.  She is discharged with knee immobilizer and crutches, told to use ice.  Prescriptions given for ibuprofen, acetaminophen, oxycodone-acetaminophen.  Follow-up with orthopedics as needed.  DG Hip Unilat W or Wo Pelvis 2-3 Views Left Result Date: 05/23/2023 CLINICAL DATA:  Fall and left hip pain. EXAM: DG HIP (WITH OR WITHOUT PELVIS) 2-3V LEFT COMPARISON:  Pelvic radiograph dated 07/14/2019. FINDINGS: No acute fracture or dislocation. Probable old fracture of the left inferior pubic ramus. Mild osteopenia. No significant arthritic changes. The soft tissues are unremarkable. IMPRESSION: Negative. Electronically Signed   By: Elgie Collard M.D.   On: 05/23/2023 23:34      Dione Booze, MD 05/24/23 [phone]

## 2023-05-23 NOTE — ED Triage Notes (Signed)
Patient arrives with [facility] after falling at friend's house after tripping over puppy that slid under her feet. Patient reports falling on left hip, injuring left hamstring and "did a split." Patient was unable to get up off floor until EMS arrived and administered 4 mg Zofran and 100 mcg fentanyl. Pt a&o x4, reports pain is now 7/10.

## 2023-05-23 NOTE — Discharge Instructions (Addendum)
You were seen in the emergency room for your fall.  We suspect that most likely you have soft tissue injury and perhaps some nerve injury/strain.  We recommend RICE treatment for now. Take Tylenol and Motrin every 6 hours.  Take the stronger narcotic pain medicine in between if needed. Muscle relaxant and lidocaine patch also have been provided to help with the muscle spasms.  Please call one of the 2 orthopedic doctor's whose contact number has been provided to set up an appointment in 7 to 10 days.

## 2023-05-23 NOTE — ED Provider Notes (Signed)
[place] EMERGENCY DEPARTMENT AT [facility] Provider Note   CSN: [id number] Arrival date & time: 05/23/23  2156     History {Add pertinent medical, surgical, social history, OB history to HPI:1} Chief Complaint  Patient presents with   Anyha Tonn Infantino is a 52 y.o. female.  HPI    52 year old female comes with chief complaint of mechanical fall. patient has history of OSA, CHF.  She is not.  Not on any anticoagulation.  She indicates that she tripped over her puppy, and slipped because of socks.  In the process she ended up having a split like stretch.  As she was falling she could hear tearing/ripping type noise.  She was unable to get up after the fall because of severe pain.  Her pain is primarily located over the gluteal/pelvic region.  The pain is radiating down the thigh.  She is having burning type sensation as well.  Since patient has a stretch fall, she did not have any pain to her chest, back, head, neck.  She has no headaches.  Home Medications Prior to Admission medications   Medication Sig Start Date End Date Taking? Authorizing Provider  esomeprazole (NEXIUM) 40 MG capsule Take 40 mg by mouth 2 (two) times daily.   Yes [provider]  FLUoxetine (PROZAC) 40 MG capsule Take 40 mg by mouth daily.   Yes [provider]  levothyroxine (SYNTHROID, LEVOTHROID) 125 MCG tablet Take 125 mcg by mouth daily before breakfast.   Yes [provider]  Sodium Sulfate-Mag Sulfate-KCl (SUTAB) [phone] MG TABS As directed 01/09/23   Ahmed, Juanetta Beets, MD  valACYclovir (VALTREX) 500 MG tablet Take 500 mg by mouth as needed.    [provider]      Allergies    Patient has no known allergies.    Review of Systems   Review of Systems  All other systems reviewed and are negative.   Physical Exam Updated Vital Signs BP 136/76 (BP Location: Left Arm)   Pulse 76   Temp 99.8 F (37.7 C) (Oral)   Resp 19   Ht 5\' 6"   (1.676 m)   Wt 94.8 kg   LMP 06/29/2022 (Approximate)   SpO2 97%   BMI 33.73 kg/m  Physical Exam Vitals and nursing note reviewed.  Constitutional:      Appearance: She is well-developed.  HENT:     Head: Normocephalic and atraumatic.  Eyes:     Extraocular Movements: Extraocular movements intact.     Pupils: Pupils are equal, round, and reactive to light.  Neck:     Comments: No midline c-spine tenderness Cardiovascular:     Rate and Rhythm: Normal rate and regular rhythm.  Pulmonary:     Effort: Pulmonary effort is normal.  Musculoskeletal:        General: No deformity.     Cervical back: Neck supple.     Comments: Patient has no tenderness over the left hip region or left femur region.  She is able to    Skin:    General: Skin is warm and dry.     Findings: No rash.  Neurological:     Mental Status: She is alert and oriented to person, place, and time.     Cranial Nerves: No cranial nerve deficit.     ED Results / Procedures / Treatments   Labs (all labs ordered are listed, but only abnormal results are displayed) Labs Reviewed - No data to display  EKG None  Radiology No results found.  Procedures Procedures  {Document cardiac monitor, telemetry assessment procedure when appropriate:1}  Medications Ordered in ED Medications  ketorolac (TORADOL) 30 MG/ML injection 15 mg (has no administration in time range)  diazepam (VALIUM) tablet 2 mg (has no administration in time range)  HYDROmorphone (DILAUDID) injection 1 mg (has no administration in time range)    ED Course/ Medical Decision Making/ A&P   {   Click here for ABCD2, HEART and other calculatorsREFRESH Note before signing :1}                              Medical Decision Making Amount and/or Complexity of Data Reviewed Radiology: ordered.  Risk Prescription drug management.   ***  {Document critical care time when appropriate:1} {Document review of labs and clinical decision tools ie  heart score, Chads2Vasc2 etc:1}  {Document your independent review of radiology images, and any outside records:1} {Document your discussion with family members, caretakers, and with consultants:1} {Document social determinants of health affecting pt's care:1} {Document your decision making why or why not admission, treatments were needed:1} Final Clinical Impression(s) / ED Diagnoses Final diagnoses:  None    Rx / DC Orders ED Discharge Orders     None

## 2023-05-24 MED ORDER — HYDROMORPHONE HCL 1 MG/ML IJ SOLN
1.0000 mg | Freq: Once | INTRAMUSCULAR | Status: AC
  Administered 2023-05-24: 1 mg via INTRAVENOUS
  Filled 2023-05-24: qty 1

## 2023-05-24 MED ORDER — ONDANSETRON HCL 4 MG/2ML IJ SOLN
4.0000 mg | Freq: Once | INTRAMUSCULAR | Status: AC
  Administered 2023-05-24: 4 mg via INTRAVENOUS
  Filled 2023-05-24: qty 2

## 2023-06-06 ENCOUNTER — Ambulatory Visit: Payer: BC Managed Care – PPO | Admitting: Orthopedic Surgery
